# Patient Record
Sex: Female | Born: 1950 | Race: White | Hispanic: No | Marital: Married | Smoking: Former smoker
Health system: Southern US, Community
[De-identification: ages and names within clinical notes are randomized; demographics above are authoritative.]

## PROBLEM LIST (undated history)

## (undated) DIAGNOSIS — Z8489 Family history of other specified conditions: Secondary | ICD-10-CM

## (undated) DIAGNOSIS — R51 Headache: Secondary | ICD-10-CM

## (undated) DIAGNOSIS — F329 Major depressive disorder, single episode, unspecified: Secondary | ICD-10-CM

## (undated) DIAGNOSIS — Z87442 Personal history of urinary calculi: Secondary | ICD-10-CM

## (undated) DIAGNOSIS — Z9889 Other specified postprocedural states: Secondary | ICD-10-CM

## (undated) DIAGNOSIS — R002 Palpitations: Secondary | ICD-10-CM

## (undated) DIAGNOSIS — F32A Depression, unspecified: Secondary | ICD-10-CM

## (undated) DIAGNOSIS — G43909 Migraine, unspecified, not intractable, without status migrainosus: Secondary | ICD-10-CM

## (undated) DIAGNOSIS — K219 Gastro-esophageal reflux disease without esophagitis: Secondary | ICD-10-CM

## (undated) DIAGNOSIS — R112 Nausea with vomiting, unspecified: Secondary | ICD-10-CM

## (undated) DIAGNOSIS — I10 Essential (primary) hypertension: Secondary | ICD-10-CM

## (undated) DIAGNOSIS — J449 Chronic obstructive pulmonary disease, unspecified: Secondary | ICD-10-CM

## (undated) DIAGNOSIS — F419 Anxiety disorder, unspecified: Secondary | ICD-10-CM

## (undated) HISTORY — PX: APPENDECTOMY: SHX54

## (undated) HISTORY — DX: Migraine, unspecified, not intractable, without status migrainosus: G43.909

## (undated) HISTORY — DX: Palpitations: R00.2

---

## 1974-01-04 HISTORY — PX: CHOLECYSTECTOMY: SHX55

## 1993-01-04 HISTORY — PX: ABDOMINAL HYSTERECTOMY: SHX81

## 2003-01-16 ENCOUNTER — Ambulatory Visit (HOSPITAL_COMMUNITY): Admission: RE | Admit: 2003-01-16 | Discharge: 2003-01-16 | Payer: Self-pay | Admitting: Cardiology

## 2004-12-05 ENCOUNTER — Emergency Department: Payer: Self-pay | Admitting: Emergency Medicine

## 2009-01-04 HISTORY — PX: OTHER SURGICAL HISTORY: SHX169

## 2009-03-17 ENCOUNTER — Inpatient Hospital Stay (HOSPITAL_COMMUNITY): Admission: AD | Admit: 2009-03-17 | Discharge: 2009-03-22 | Payer: Self-pay | Admitting: General Surgery

## 2009-03-19 ENCOUNTER — Encounter (INDEPENDENT_AMBULATORY_CARE_PROVIDER_SITE_OTHER): Payer: Self-pay | Admitting: General Surgery

## 2010-01-04 HISTORY — PX: EYE SURGERY: SHX253

## 2010-01-25 ENCOUNTER — Encounter: Payer: Self-pay | Admitting: Family Medicine

## 2010-03-27 ENCOUNTER — Ambulatory Visit (HOSPITAL_COMMUNITY): Payer: Self-pay

## 2010-03-27 ENCOUNTER — Ambulatory Visit (HOSPITAL_COMMUNITY)
Admission: AD | Admit: 2010-03-27 | Discharge: 2010-03-27 | Disposition: A | Discharge status: Home / Self Care | Payer: Self-pay | Source: Ambulatory Visit | Attending: Ophthalmology | Admitting: Ophthalmology

## 2010-03-27 DIAGNOSIS — Z01818 Encounter for other preprocedural examination: Secondary | ICD-10-CM | POA: Insufficient documentation

## 2010-03-27 DIAGNOSIS — Z01810 Encounter for preprocedural cardiovascular examination: Secondary | ICD-10-CM | POA: Insufficient documentation

## 2010-03-27 DIAGNOSIS — H44009 Unspecified purulent endophthalmitis, unspecified eye: Secondary | ICD-10-CM | POA: Insufficient documentation

## 2010-03-27 DIAGNOSIS — Z01812 Encounter for preprocedural laboratory examination: Secondary | ICD-10-CM | POA: Insufficient documentation

## 2010-03-27 DIAGNOSIS — H11419 Vascular abnormalities of conjunctiva, unspecified eye: Secondary | ICD-10-CM | POA: Insufficient documentation

## 2010-03-27 LAB — GRAM STAIN: Special Requests: 2

## 2010-03-27 LAB — BASIC METABOLIC PANEL
BUN: 14 mg/dL (ref 6–23)
CO2: 28 mEq/L (ref 19–32)
Chloride: 102 mEq/L (ref 96–112)
Creatinine, Ser: 0.59 mg/dL (ref 0.4–1.2)
GFR calc Af Amer: >60 mL/min (ref >60)
Potassium: 4 mEq/L (ref 3.5–5.1)

## 2010-03-27 LAB — CBC
Hemoglobin: 12.5 g/dL (ref 12.0–15.0)
MCH: 33.2 pg (ref 26.0–34.0)
MCV: 98.7 fL (ref 78.0–100.0)
RBC: 3.77 MIL/uL — ABNORMAL LOW (ref 3.87–5.11)
WBC: 8.2 10*3/uL (ref 4.0–10.5)

## 2010-03-27 LAB — SURGICAL PCR SCREEN: MRSA, PCR: NEGATIVE

## 2010-03-29 LAB — EYE CULTURE

## 2010-03-30 LAB — BASIC METABOLIC PANEL
BUN: 17 mg/dL (ref 6–23)
CO2: 25 mEq/L (ref 19–32)
CO2: 26 mEq/L (ref 19–32)
CO2: 31 mEq/L (ref 19–32)
Calcium: 8.3 mg/dL — ABNORMAL LOW (ref 8.4–10.5)
Calcium: 8.6 mg/dL (ref 8.4–10.5)
Chloride: 104 mEq/L (ref 96–112)
Creatinine, Ser: 0.5 mg/dL (ref 0.4–1.2)
Creatinine, Ser: 0.66 mg/dL (ref 0.4–1.2)
Creatinine, Ser: 0.72 mg/dL (ref 0.4–1.2)
GFR calc Af Amer: >60 mL/min (ref >60)
GFR calc Af Amer: >60 mL/min (ref >60)
GFR calc non Af Amer: >60 mL/min (ref >60)
GFR calc non Af Amer: >60 mL/min (ref >60)
Glucose, Bld: 95 mg/dL (ref 70–99)
Potassium: 3.4 mEq/L — ABNORMAL LOW (ref 3.5–5.1)
Sodium: 139 mEq/L (ref 135–145)
Sodium: 141 mEq/L (ref 135–145)

## 2010-03-30 LAB — CBC
HCT: 33.7 % — ABNORMAL LOW (ref 36.0–46.0)
Hemoglobin: 9.5 g/dL — ABNORMAL LOW (ref 12.0–15.0)
MCHC: 34.3 g/dL (ref 30.0–36.0)
MCHC: 34.4 g/dL (ref 30.0–36.0)
MCHC: 34.7 g/dL (ref 30.0–36.0)
MCV: 100.5 fL — ABNORMAL HIGH (ref 78.0–100.0)
Platelets: 254 10*3/uL (ref 150–400)
RBC: 2.74 MIL/uL — ABNORMAL LOW (ref 3.87–5.11)
RDW: 13.8 % (ref 11.5–15.5)
WBC: 10.2 10*3/uL (ref 4.0–10.5)
WBC: 6.9 10*3/uL (ref 4.0–10.5)

## 2010-03-30 LAB — DIFFERENTIAL
Basophils Absolute: 0 10*3/uL (ref 0.0–0.1)
Basophils Relative: 0 % (ref 0–1)
Basophils Relative: 0 % (ref 0–1)
Basophils Relative: 1 % (ref 0–1)
Eosinophils Absolute: 0.1 10*3/uL (ref 0.0–0.7)
Eosinophils Relative: 1 % (ref 0–5)
Lymphocytes Relative: 14 % (ref 12–46)
Lymphocytes Relative: 16 % (ref 12–46)
Lymphs Abs: 1.3 10*3/uL (ref 0.7–4.0)
Monocytes Relative: 7 % (ref 3–12)
Monocytes Relative: 8 % (ref 3–12)
Neutro Abs: 5.1 10*3/uL (ref 1.7–7.7)
Neutro Abs: 6.3 10*3/uL (ref 1.7–7.7)
Neutrophils Relative %: 74 % (ref 43–77)
Neutrophils Relative %: 76 % (ref 43–77)
Neutrophils Relative %: 79 % — ABNORMAL HIGH (ref 43–77)

## 2010-03-30 LAB — VANCOMYCIN, TROUGH: Vancomycin Tr: 11.5 ug/mL (ref 10.0–20.0)

## 2010-04-03 LAB — EYE CULTURE: Culture: NO GROWTH

## 2010-04-03 NOTE — Op Note (Signed)
  NAMESATYA, Alisha Shaw                  ACCOUNT NO.:  [id number]  MEDICAL RECORD NO.:  [id number]           PATIENT TYPE:  O  LOCATION:  SDSC                         FACILITY:  [facility]  PHYSICIAN:  John D. Ashley Royalty, M.D. 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  PROCEDURES:  Cultures of eyelids, aqueous and vitreous, pars plana vitrectomy, Endo cautery, vitreous injection of antibiotics and anti- inflammatory medications, subconjunctival injections of antibiotics and anti-inflammatory medications in the left eye.  SURGEON:  Beulah Gandy. Ashley Royalty, MD  ASSISTANT:  Rosalie Doctor, MA  ANESTHESIA:  General.  DETAILS OF PROCEDURE:  The eyelids and lashes were cultured eye with culture plates on the table.  The vitreous was cultured with 27-gauge needle biopsy.  The vitreous material was placed on culture plates and culture media.  The aqueous was cultured and placed on culture media.  A 25-gauge trocars placed at 10, 2, and 4 o'clock, infusion at 4 o'clock. The lighted pick and the cutter placed at 10 and 2 o'clock respectively. The pars plana vitrectomy was begun just behind the cataractous lens. Dense white material was encountered immediately and this was removed centrally in a core fashion.  The vitrectomy was widened and it was apparent that the entire retina was white.  The vitrectomy was widened into the mid peripheral region where a band of bleeding retina was seen temporally.  These areas were cauterized with the vitreous cutter with the Endo cautery.  Vitrectomy was carried out into the far periphery where additional infected vitreous material was removed.  The vitreous cavity was rinsed and rinsed so that most white cells were removed.  The instruments removed from the eye.  Vancomycin 1 mg and 110  mL was injected into the vitreous cavity, ceftazidime 2.25 mg in 0.1 mL was injected in the vitreous cavity, Decadron 400 mcg in 110 mL was injected into the vitreous cavity.  The pressure was equalized and 25-gauge trocars were removed.  Subconjunctival injections of vancomycin 25 mg, ceftazidime 100 mg, Decadron 10 mg were injected around the globe for, intravenous vancomycin 1 gram, and Kefzol 1 gram were given after cultures were taken.  Polysporin ophthalmic ointment, patch and shield were placed.  Closing pressure was 10 with a Risk manager.  The patient was awakened and taken to recovery in satisfactory condition.  COMPLICATIONS:  None.  DURATION:  One hour.     Beulah Gandy. Ashley Royalty, M.D.     JDM/MEDQ  D:  03/27/2010  T:  03/28/2010  Job:  [id number]  Electronically Signed by Alan Mulder M.D. on 04/03/2010 06:14:09 AM

## 2010-04-27 LAB — FUNGUS CULTURE W SMEAR: Fungal Smear: NONE SEEN

## 2010-08-24 ENCOUNTER — Encounter (INDEPENDENT_AMBULATORY_CARE_PROVIDER_SITE_OTHER): Payer: Self-pay | Admitting: Ophthalmology

## 2010-08-24 DIAGNOSIS — H43819 Vitreous degeneration, unspecified eye: Secondary | ICD-10-CM

## 2010-08-24 DIAGNOSIS — H33009 Unspecified retinal detachment with retinal break, unspecified eye: Secondary | ICD-10-CM

## 2010-08-24 DIAGNOSIS — H353290 Exudative age-related macular degeneration, unspecified eye, stage unspecified: Secondary | ICD-10-CM

## 2010-08-24 DIAGNOSIS — H3530 Unspecified macular degeneration: Secondary | ICD-10-CM

## 2010-10-26 ENCOUNTER — Encounter (INDEPENDENT_AMBULATORY_CARE_PROVIDER_SITE_OTHER): Payer: Self-pay | Admitting: Ophthalmology

## 2010-10-26 DIAGNOSIS — H43819 Vitreous degeneration, unspecified eye: Secondary | ICD-10-CM

## 2010-10-26 DIAGNOSIS — H33009 Unspecified retinal detachment with retinal break, unspecified eye: Secondary | ICD-10-CM

## 2010-10-26 DIAGNOSIS — H2510 Age-related nuclear cataract, unspecified eye: Secondary | ICD-10-CM

## 2010-10-26 DIAGNOSIS — H3530 Unspecified macular degeneration: Secondary | ICD-10-CM

## 2011-01-11 ENCOUNTER — Encounter (INDEPENDENT_AMBULATORY_CARE_PROVIDER_SITE_OTHER): Payer: Self-pay | Admitting: Ophthalmology

## 2011-01-11 DIAGNOSIS — H43819 Vitreous degeneration, unspecified eye: Secondary | ICD-10-CM

## 2011-01-11 DIAGNOSIS — H33009 Unspecified retinal detachment with retinal break, unspecified eye: Secondary | ICD-10-CM

## 2011-01-11 DIAGNOSIS — H3530 Unspecified macular degeneration: Secondary | ICD-10-CM

## 2011-01-11 DIAGNOSIS — H2510 Age-related nuclear cataract, unspecified eye: Secondary | ICD-10-CM

## 2011-04-12 ENCOUNTER — Ambulatory Visit (INDEPENDENT_AMBULATORY_CARE_PROVIDER_SITE_OTHER): Payer: Self-pay | Admitting: Ophthalmology

## 2011-04-12 DIAGNOSIS — H33009 Unspecified retinal detachment with retinal break, unspecified eye: Secondary | ICD-10-CM

## 2011-04-12 DIAGNOSIS — H3530 Unspecified macular degeneration: Secondary | ICD-10-CM

## 2011-04-12 DIAGNOSIS — H2510 Age-related nuclear cataract, unspecified eye: Secondary | ICD-10-CM

## 2011-04-12 DIAGNOSIS — H43819 Vitreous degeneration, unspecified eye: Secondary | ICD-10-CM

## 2011-07-12 ENCOUNTER — Encounter (INDEPENDENT_AMBULATORY_CARE_PROVIDER_SITE_OTHER): Payer: Self-pay | Admitting: Ophthalmology

## 2011-07-12 DIAGNOSIS — H3530 Unspecified macular degeneration: Secondary | ICD-10-CM

## 2011-07-12 DIAGNOSIS — H43819 Vitreous degeneration, unspecified eye: Secondary | ICD-10-CM

## 2011-07-12 DIAGNOSIS — H2510 Age-related nuclear cataract, unspecified eye: Secondary | ICD-10-CM

## 2011-07-12 DIAGNOSIS — I10 Essential (primary) hypertension: Secondary | ICD-10-CM

## 2011-07-12 DIAGNOSIS — H35039 Hypertensive retinopathy, unspecified eye: Secondary | ICD-10-CM

## 2011-07-12 DIAGNOSIS — H33009 Unspecified retinal detachment with retinal break, unspecified eye: Secondary | ICD-10-CM

## 2011-07-13 ENCOUNTER — Encounter (INDEPENDENT_AMBULATORY_CARE_PROVIDER_SITE_OTHER): Payer: Self-pay | Admitting: Ophthalmology

## 2011-11-15 ENCOUNTER — Ambulatory Visit (INDEPENDENT_AMBULATORY_CARE_PROVIDER_SITE_OTHER): Payer: Self-pay | Admitting: Ophthalmology

## 2011-11-15 DIAGNOSIS — H3530 Unspecified macular degeneration: Secondary | ICD-10-CM

## 2011-11-15 DIAGNOSIS — H33009 Unspecified retinal detachment with retinal break, unspecified eye: Secondary | ICD-10-CM

## 2011-11-15 DIAGNOSIS — H35039 Hypertensive retinopathy, unspecified eye: Secondary | ICD-10-CM

## 2011-11-15 DIAGNOSIS — H2510 Age-related nuclear cataract, unspecified eye: Secondary | ICD-10-CM

## 2011-11-15 DIAGNOSIS — H43819 Vitreous degeneration, unspecified eye: Secondary | ICD-10-CM

## 2011-11-15 DIAGNOSIS — I10 Essential (primary) hypertension: Secondary | ICD-10-CM

## 2012-01-08 IMAGING — CR DG CHEST 2V
2 series · 2 of 2 positions shown · non-contrast
Comparison: 03/20/2009

CLINICAL DATA: Preop, hypertension

CHEST - 2 VIEW

[view not recorded (1 of 2)]
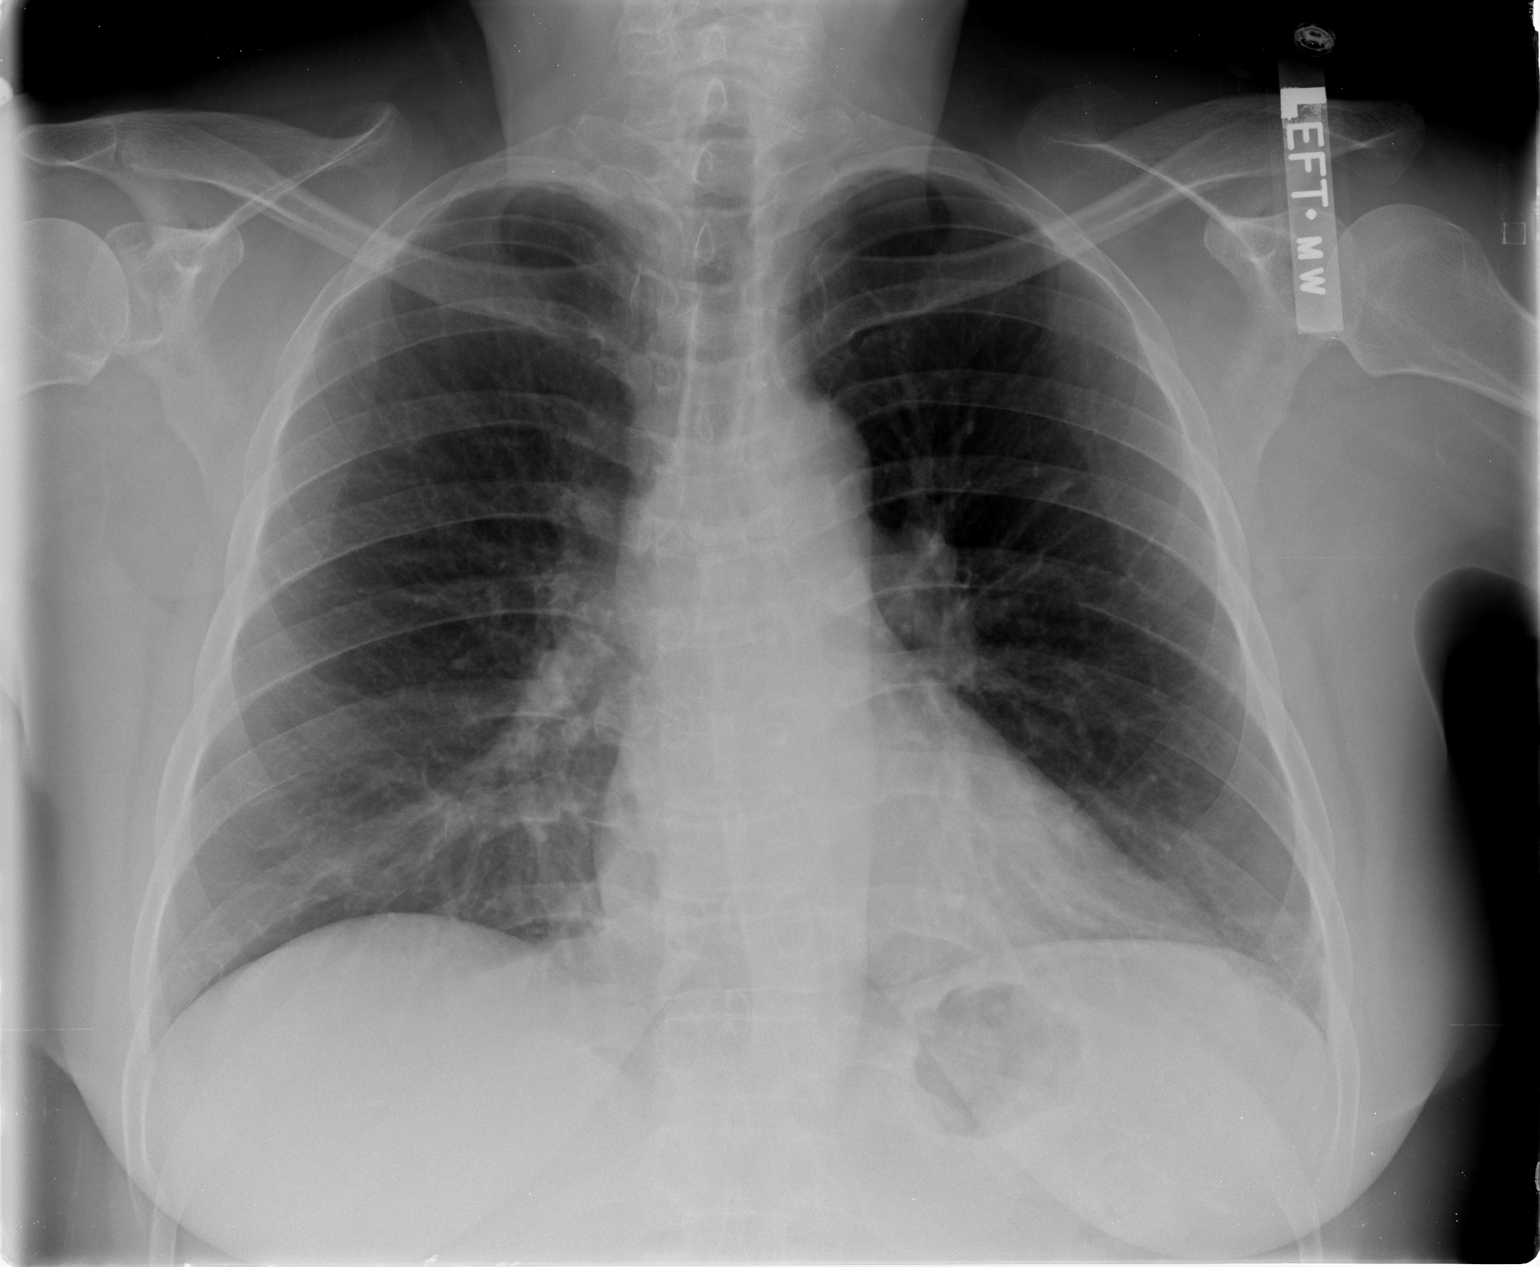

[view not recorded (2 of 2)]
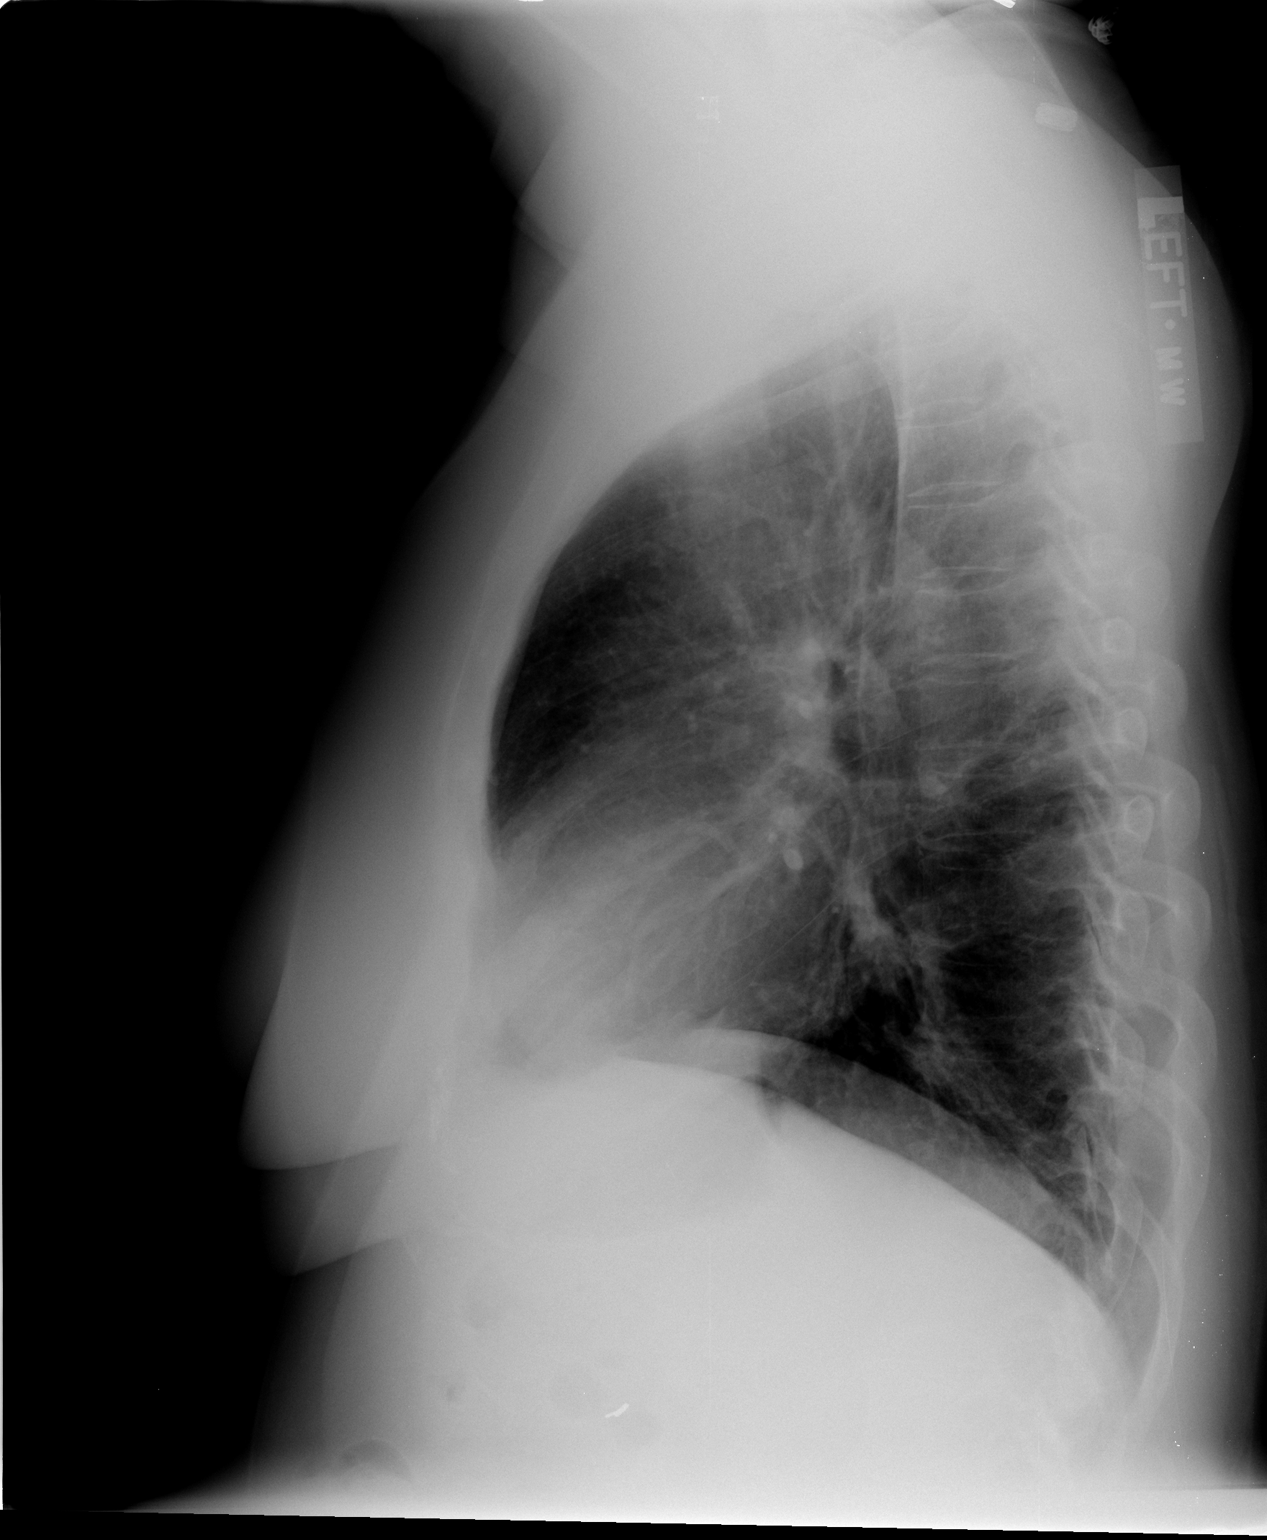

[2 of 2 positions shown; findings below may reference images not displayed]

FINDINGS: Cardiomediastinal silhouette is stable.  No acute
infiltrate or pleural effusion.  No pulmonary edema.  Bony thorax
is unremarkable.
IMPRESSION: .  No active disease.

## 2012-05-15 ENCOUNTER — Ambulatory Visit (INDEPENDENT_AMBULATORY_CARE_PROVIDER_SITE_OTHER): Payer: BC Managed Care – PPO | Admitting: Ophthalmology

## 2012-05-15 DIAGNOSIS — I10 Essential (primary) hypertension: Secondary | ICD-10-CM

## 2012-05-15 DIAGNOSIS — H2510 Age-related nuclear cataract, unspecified eye: Secondary | ICD-10-CM

## 2012-05-15 DIAGNOSIS — H43819 Vitreous degeneration, unspecified eye: Secondary | ICD-10-CM

## 2012-05-15 DIAGNOSIS — H35039 Hypertensive retinopathy, unspecified eye: Secondary | ICD-10-CM

## 2012-05-15 DIAGNOSIS — H3530 Unspecified macular degeneration: Secondary | ICD-10-CM

## 2012-07-13 ENCOUNTER — Encounter (HOSPITAL_COMMUNITY): Payer: Self-pay

## 2012-07-13 ENCOUNTER — Encounter (HOSPITAL_COMMUNITY)
Admission: RE | Admit: 2012-07-13 | Discharge: 2012-07-13 | Disposition: A | Discharge status: Home / Self Care | Payer: BC Managed Care – PPO | Source: Ambulatory Visit | Attending: General Surgery | Admitting: General Surgery

## 2012-07-13 HISTORY — DX: Essential (primary) hypertension: I10

## 2012-07-13 HISTORY — DX: Gastro-esophageal reflux disease without esophagitis: K21.9

## 2012-07-13 HISTORY — DX: Headache: R51

## 2012-07-13 HISTORY — DX: Anxiety disorder, unspecified: F41.9

## 2012-07-13 HISTORY — DX: Depression, unspecified: F32.A

## 2012-07-13 HISTORY — DX: Major depressive disorder, single episode, unspecified: F32.9

## 2012-07-13 LAB — CBC WITH DIFFERENTIAL/PLATELET
HCT: 36.4 % (ref 36.0–46.0)
Hemoglobin: 12.6 g/dL (ref 12.0–15.0)
Lymphocytes Relative: 19 % (ref 12–46)
MCHC: 34.6 g/dL (ref 30.0–36.0)
MCV: 99.2 fL (ref 78.0–100.0)
Monocytes Absolute: 0.7 10*3/uL (ref 0.1–1.0)
Monocytes Relative: 8 % (ref 3–12)
Neutro Abs: 6.1 10*3/uL (ref 1.7–7.7)
WBC: 8.7 10*3/uL (ref 4.0–10.5)

## 2012-07-13 LAB — BASIC METABOLIC PANEL
BUN: 19 mg/dL (ref 6–23)
CO2: 30 mEq/L (ref 19–32)
Chloride: 96 mEq/L (ref 96–112)
GFR calc Af Amer: >90 mL/min (ref >90)
Potassium: 3.1 mEq/L — ABNORMAL LOW (ref 3.5–5.1)

## 2012-07-13 MED ORDER — CHLORHEXIDINE GLUCONATE 4 % EX LIQD: 1.0000 "application " | Freq: Once | CUTANEOUS | Status: DC

## 2012-07-13 NOTE — Patient Instructions (Addendum)
Alisha Shaw  07/13/2012   Your procedure is scheduled on:  07/17/12  Report to [facility] at 07:20 AM.  Call this number if you have problems the morning of surgery: [phone]   Remember:   Do not eat food or drink liquids after midnight.   Take these medicines the morning of surgery with A SIP OF WATER: Fluoxetine and Hydrochlorothiazide.   Do not wear jewelry, make-up or nail polish.  Do not wear lotions, powders, or perfumes.   Do not shave 48 hours prior to surgery. Men may shave face and neck.  Do not bring valuables to the hospital.  [facility] is not responsible for any belongings or valuables.  Contacts, dentures or bridgework may not be worn into surgery.  Leave suitcase in the car. After surgery it may be brought to your room.  For patients admitted to the hospital, checkout time is 11:00 AM the [place] of discharge.   Patients discharged the [place] of surgery will not be allowed to drive home.    Special Instructions: Shower using CHG 2 nights before surgery and the night before surgery.  If you shower the [place] of surgery use CHG.  Use special wash - you have one bottle of CHG for all showers.  You should use approximately 1/3 of the bottle for each shower.   Please read over the following fact sheets that you were given: Pain Booklet, Surgical Site Infection Prevention, Anesthesia Post-op Instructions and Care and Recovery After Surgery    Hemorrhoidectomy Hemorrhoidectomy is surgery to remove hemorrhoids. Hemorrhoids are veins that have become swollen in the rectum. The rectum is the area from the bottom end of the intestines to the opening where bowel movements leave the body. Hemorrhoids can be uncomfortable. They can cause itching, bleeding and pain if a blood clot forms in them (thrombose). If hemorrhoids are small, surgery may not be needed. But if they cover a larger area, surgery is usually suggested.  LET YOUR CAREGIVER KNOW ABOUT:   Any allergies.  All medications you  are taking, including:  Herbs, eyedrops, over-the-counter medications and creams.  Blood thinners (anticoagulants), aspirin or other drugs that could affect blood clotting.  Use of steroids (by mouth or as creams).  Previous problems with anesthetics, including local anesthetics.  Possibility of pregnancy, if this applies.  Any history of blood clots.  Any history of bleeding or other blood problems.  Previous surgery.  Smoking history.  Other health problems. RISKS AND COMPLICATIONS All surgery carries some risk. However, hemorrhoid surgery usually goes smoothly. Possible complications could include:  Urinary retention.  Bleeding.  Infection.  A painful incision.  A reaction to the anesthesia (this is not common). BEFORE THE PROCEDURE   Stop using aspirin and non-steroidal anti-inflammatory drugs (NSAIDs) for pain relief. This includes prescription drugs and over-the-counter drugs such as ibuprofen and naproxen. Also stop taking vitamin E. If possible, do this two weeks before your surgery.  If you take blood-thinners, ask your healthcare provider when you should stop taking them.  You will probably have blood and urine tests done several days before your surgery.  Do not eat or drink for about 8 hours before the surgery.  Arrive at least an hour before the surgery, or whenever your surgeon recommends. This will give you time to check in and fill out any needed paperwork.  Hemorrhoidectomy is often an outpatient procedure. This means you will be able to go home the same [place]. Sometimes, though, people stay overnight  in the hospital after the procedure. Ask your surgeon what to expect. Either way, make arrangements in advance for someone to drive you home. PROCEDURE   The preparation:  You will change into a hospital gown.  You will be given an IV. A needle will be inserted in your arm. Medication can flow directly into your body through this needle.  You might be  given an enema to clear your rectum.  Once in the operating room, you will probably lie on your side or be repositioned later to lying on your stomach.  You will be given anesthesia (medication) so you will not feel anything during the surgery. The surgery often is done with local anesthesia (the area near the hemorrhoids will be numb and you will be drowsy but awake). Sometimes, general anesthesia is used (you will be asleep during the procedure).  The procedure:  There are a few different procedures for hemorrhoids. Be sure to ask you surgeon about the procedure, the risks and benefits.  Be sure to ask about what you need to do to take care of the wound, if there is one. AFTER THE PROCEDURE  You will stay in a recovery area until the anesthesia has worn off. Your blood pressure and pulse will be checked every so often.  You may feel a lot of pain in the area of the rectum.  Take all pain medication prescribed by your surgeon. Ask before taking any over-the-counter pain medicines.  Sometimes sitting in a warm bath can help relieve your pain.  To make sure you have bowel movements without straining:  You will probably need to take stool softeners (usually a pill) for a few days.  You should drink 8 to 10 glasses of water each Study.  Your activity will be restricted for awhile. Ask your caregiver for a list of what you should and should not do while you recover. Document Released: 10/18/2008 Document Revised: 03/15/2011 Document Reviewed: 10/18/2008 [facility] Patient Information 2014 [place], [place].    PATIENT INSTRUCTIONS POST-ANESTHESIA  IMMEDIATELY FOLLOWING SURGERY:  Do not drive or operate machinery for the first twenty four hours after surgery.  Do not make any important decisions for twenty four hours after surgery or while taking narcotic pain medications or sedatives.  If you develop intractable nausea and vomiting or a severe headache please notify your doctor  immediately.  FOLLOW-UP:  Please make an appointment with your surgeon as instructed. You do not need to follow up with anesthesia unless specifically instructed to do so.  WOUND CARE INSTRUCTIONS (if applicable):  Keep a dry clean dressing on the anesthesia/puncture wound site if there is drainage.  Once the wound has quit draining you may leave it open to air.  Generally you should leave the bandage intact for twenty four hours unless there is drainage.  If the epidural site drains for more than 36-48 hours please call the anesthesia department.  QUESTIONS?:  Please feel free to call your physician or the hospital operator if you have any questions, and they will be happy to assist you.

## 2012-07-13 NOTE — H&P (Signed)
  NTS SOAP Note  Vital Signs:  Vitals as of: 07/13/2012: Systolic 154: Diastolic 73: Heart Rate 88: Temp 97.82F: Height 58ft 4in: Weight 181Lbs 0 Ounces: Pain Level 5: BMI 31.07  BMI : 31.07 kg/m2  Subjective: This 62 Years 24 Months old Female presents for of blood per rectum.  Has had hemorrhoidal problems for years.  Denies constipation.  Never has had a colonoscopy.  Feels pressure sensation in the rectum.  Review of Symptoms:  Constitutional:unremarkable   Head:unremarkable    Eyes:  blurred vision bilateral sinus problems Respiratory:  wheezing Gastrointestin    nausea,heartburn Genitourinary:unremarkable       neck and back pain Skin:unremarkable Hematolgic/Lymphatic:unremarkable     Allergic/Immunologic:unremarkable     Past Medical History:    Reviewed   Past Medical History  Surgical History: cholecystectomy, TAH, eye surgery Medical Problems:  High Blood pressure, High cholesterol, macular degeneration Allergies: nkda Medications: HCTZ, losartin, prozac, simvastatin, butabutol   Social History:Reviewed  Social History  Preferred Language: English Race:  White Ethnicity: Not Hispanic / Latino Age: 62 Years 5 Months Marital Status:  M Alcohol: socially Recreational drug(s):  No   Smoking Status: Current every Tays smoker reviewed on 07/13/2012 Started Date: 01/05/1971 Packs per Kloss: 2.00 Functional Status reviewed on mm/dd/yyyy ------------------------------------------------ Bathing: Normal Cooking: Normal Dressing: Normal Driving: Normal Eating: Normal Managing Meds: Normal Oral Care: Normal Shopping: Normal Toileting: Normal Transferring: Normal Walking: Normal Cognitive Status reviewed on mm/dd/yyyy ------------------------------------------------ Attention: Normal Decision Making: Normal Language: Normal Memory: Normal Motor: Normal Perception: Normal Problem Solving: Normal Visual and Spatial:  Normal   Family History:  Reviewed  Family Health History Mother, Deceased; Heart disease;  Father, Deceased; Heart disease;     Objective Information: General:  Well appearing, well nourished in no distress. Heart:  RRR, no murmur Lungs:    CTA bilaterally, no wheezes, rhonchi, rales.  Breathing unlabored.   Protruding hemorrhoid present anteriorly.  No mass noted in rectum.  Normal sphincter tone.  Assessment:Hemorrhoidal bleeding  Diagnosis &amp; Procedure Smart Code   Plan:Scheduled for hemorrhoidectomy on 07/17/12.  Will get TCS later this year.   Patient Education:Alternative treatments to surgery were discussed with patient (and family).  Risks and benefits  of procedure were fully explained to the patient (and family) who gave informed consent. Patient/family questions were addressed.  Follow-up:Pending Surgery

## 2012-07-13 NOTE — Progress Notes (Signed)
Pt's pre-op labwork for surgery on 07/17/12 showed a K+ level of 3.1. Pt is on HCTZ. Dr. Jayme Cloud notified and he notified Dr. York Ram and he is going to order K+ supplementation. Dr. Jayme Cloud is ok with this plan of care.

## 2012-07-17 ENCOUNTER — Encounter (HOSPITAL_COMMUNITY)
Admission: RE | Disposition: A | Discharge status: Home / Self Care | Payer: Self-pay | Source: Ambulatory Visit | Attending: General Surgery

## 2012-07-17 ENCOUNTER — Ambulatory Visit (HOSPITAL_COMMUNITY): Payer: BC Managed Care – PPO | Admitting: Anesthesiology

## 2012-07-17 ENCOUNTER — Encounter (HOSPITAL_COMMUNITY): Payer: Self-pay | Admitting: *Deleted

## 2012-07-17 ENCOUNTER — Encounter (HOSPITAL_COMMUNITY): Payer: Self-pay | Admitting: Anesthesiology

## 2012-07-17 ENCOUNTER — Ambulatory Visit (HOSPITAL_COMMUNITY)
Admission: RE | Admit: 2012-07-17 | Discharge: 2012-07-17 | Disposition: A | Discharge status: Home / Self Care | Payer: BC Managed Care – PPO | Source: Ambulatory Visit | Attending: General Surgery | Admitting: General Surgery

## 2012-07-17 DIAGNOSIS — I10 Essential (primary) hypertension: Secondary | ICD-10-CM | POA: Insufficient documentation

## 2012-07-17 DIAGNOSIS — Z01812 Encounter for preprocedural laboratory examination: Secondary | ICD-10-CM | POA: Insufficient documentation

## 2012-07-17 DIAGNOSIS — Z79899 Other long term (current) drug therapy: Secondary | ICD-10-CM | POA: Insufficient documentation

## 2012-07-17 DIAGNOSIS — K648 Other hemorrhoids: Secondary | ICD-10-CM | POA: Insufficient documentation

## 2012-07-17 DIAGNOSIS — Z01810 Encounter for preprocedural cardiovascular examination: Secondary | ICD-10-CM | POA: Insufficient documentation

## 2012-07-17 DIAGNOSIS — K644 Residual hemorrhoidal skin tags: Secondary | ICD-10-CM | POA: Insufficient documentation

## 2012-07-17 HISTORY — PX: HEMORRHOID SURGERY: SHX153

## 2012-07-17 LAB — POCT I-STAT 4, (NA,K, GLUC, HGB,HCT)
Glucose, Bld: 96 mg/dL (ref 70–99)
HCT: 33 % — ABNORMAL LOW (ref 36.0–46.0)
Hemoglobin: 11.2 g/dL — ABNORMAL LOW (ref 12.0–15.0)
Potassium: 3.9 mEq/L (ref 3.5–5.1)

## 2012-07-17 SURGERY — HEMORRHOIDECTOMY
Anesthesia: General | Site: Rectum | Wound class: Dirty or Infected

## 2012-07-17 MED ORDER — SUCCINYLCHOLINE CHLORIDE 20 MG/ML IJ SOLN
INTRAMUSCULAR | Status: DC | PRN
  Administered 2012-07-17: 120 mg via INTRAVENOUS

## 2012-07-17 MED ORDER — FENTANYL CITRATE 0.05 MG/ML IJ SOLN
INTRAMUSCULAR | Status: AC
  Filled 2012-07-17: qty 2

## 2012-07-17 MED ORDER — GLYCOPYRROLATE 0.2 MG/ML IJ SOLN
0.2000 mg | Freq: Once | INTRAMUSCULAR | Status: AC
  Administered 2012-07-17: 0.2 mg via INTRAVENOUS

## 2012-07-17 MED ORDER — LIDOCAINE VISCOUS 2 % MT SOLN
OROMUCOSAL | Status: AC
  Filled 2012-07-17: qty 15

## 2012-07-17 MED ORDER — HYDROCODONE-ACETAMINOPHEN 5-325 MG PO TABS: 1.0000 | ORAL_TABLET | ORAL | Status: DC | PRN

## 2012-07-17 MED ORDER — SUCCINYLCHOLINE CHLORIDE 20 MG/ML IJ SOLN
INTRAMUSCULAR | Status: AC
  Filled 2012-07-17: qty 1

## 2012-07-17 MED ORDER — ROCURONIUM BROMIDE 100 MG/10ML IV SOLN
INTRAVENOUS | Status: DC | PRN
  Administered 2012-07-17: 5 mg via INTRAVENOUS

## 2012-07-17 MED ORDER — OXYCODONE-ACETAMINOPHEN 7.5-325 MG PO TABS: 1.0000 | ORAL_TABLET | Freq: Four times a day (QID) | ORAL | Status: AC | PRN

## 2012-07-17 MED ORDER — PROPOFOL 10 MG/ML IV EMUL
INTRAVENOUS | Status: AC
  Filled 2012-07-17: qty 20

## 2012-07-17 MED ORDER — KETOROLAC TROMETHAMINE 30 MG/ML IJ SOLN
30.0000 mg | Freq: Once | INTRAMUSCULAR | Status: AC
  Administered 2012-07-17: 30 mg via INTRAVENOUS

## 2012-07-17 MED ORDER — MIDAZOLAM HCL 2 MG/2ML IJ SOLN
1.0000 mg | INTRAMUSCULAR | Status: DC | PRN
  Administered 2012-07-17 (×2): 2 mg via INTRAVENOUS

## 2012-07-17 MED ORDER — BUPIVACAINE HCL (PF) 0.5 % IJ SOLN
INTRAMUSCULAR | Status: DC | PRN
  Administered 2012-07-17: 10 mL

## 2012-07-17 MED ORDER — FENTANYL CITRATE 0.05 MG/ML IJ SOLN
INTRAMUSCULAR | Status: DC | PRN
  Administered 2012-07-17 (×2): 50 ug via INTRAVENOUS

## 2012-07-17 MED ORDER — BUPIVACAINE HCL (PF) 0.5 % IJ SOLN
INTRAMUSCULAR | Status: AC
  Filled 2012-07-17: qty 30

## 2012-07-17 MED ORDER — LIDOCAINE VISCOUS 2 % MT SOLN
OROMUCOSAL | Status: DC | PRN
  Administered 2012-07-17: 20 mL

## 2012-07-17 MED ORDER — SODIUM CHLORIDE 0.9 % IR SOLN
Status: DC | PRN
  Administered 2012-07-17: 1000 mL

## 2012-07-17 MED ORDER — FENTANYL CITRATE 0.05 MG/ML IJ SOLN
25.0000 ug | INTRAMUSCULAR | Status: DC | PRN
  Administered 2012-07-17: 25 ug via INTRAVENOUS
  Administered 2012-07-17 (×4): 50 ug via INTRAVENOUS

## 2012-07-17 MED ORDER — METRONIDAZOLE IN NACL 5-0.79 MG/ML-% IV SOLN
500.0000 mg | INTRAVENOUS | Status: AC
  Administered 2012-07-17: 500 mg via INTRAVENOUS

## 2012-07-17 MED ORDER — LACTATED RINGERS IV SOLN
INTRAVENOUS | Status: DC
  Administered 2012-07-17: 1000 mL via INTRAVENOUS

## 2012-07-17 MED ORDER — LIDOCAINE HCL (CARDIAC) 10 MG/ML IV SOLN
INTRAVENOUS | Status: DC | PRN
  Administered 2012-07-17: 20 mg via INTRAVENOUS

## 2012-07-17 MED ORDER — MIDAZOLAM HCL 2 MG/2ML IJ SOLN
INTRAMUSCULAR | Status: AC
  Filled 2012-07-17: qty 2

## 2012-07-17 MED ORDER — ONDANSETRON HCL 4 MG/2ML IJ SOLN: 4.0000 mg | Freq: Once | INTRAMUSCULAR | Status: DC | PRN

## 2012-07-17 MED ORDER — ONDANSETRON HCL 4 MG/2ML IJ SOLN
INTRAMUSCULAR | Status: AC
  Filled 2012-07-17: qty 2

## 2012-07-17 MED ORDER — DEXAMETHASONE SODIUM PHOSPHATE 4 MG/ML IJ SOLN
4.0000 mg | Freq: Once | INTRAMUSCULAR | Status: AC
  Administered 2012-07-17: 4 mg via INTRAVENOUS

## 2012-07-17 MED ORDER — ONDANSETRON HCL 4 MG/2ML IJ SOLN
4.0000 mg | Freq: Once | INTRAMUSCULAR | Status: AC
  Administered 2012-07-17: 4 mg via INTRAVENOUS

## 2012-07-17 MED ORDER — FENTANYL CITRATE 0.05 MG/ML IJ SOLN: 25.0000 ug | Freq: Once | INTRAMUSCULAR | Status: DC

## 2012-07-17 MED ORDER — PROPOFOL 10 MG/ML IV BOLUS
INTRAVENOUS | Status: DC | PRN
  Administered 2012-07-17: 20 mg via INTRAVENOUS
  Administered 2012-07-17 (×2): 30 mg via INTRAVENOUS
  Administered 2012-07-17: 120 mg via INTRAVENOUS

## 2012-07-17 MED ORDER — METRONIDAZOLE IN NACL 5-0.79 MG/ML-% IV SOLN
INTRAVENOUS | Status: AC
  Filled 2012-07-17: qty 100

## 2012-07-17 MED ORDER — GLYCOPYRROLATE 0.2 MG/ML IJ SOLN
INTRAMUSCULAR | Status: AC
  Filled 2012-07-17: qty 1

## 2012-07-17 MED ORDER — HEMOSTATIC AGENTS (NO CHARGE) OPTIME
TOPICAL | Status: DC | PRN
  Administered 2012-07-17: 1 via TOPICAL

## 2012-07-17 MED ORDER — DEXAMETHASONE SODIUM PHOSPHATE 4 MG/ML IJ SOLN
INTRAMUSCULAR | Status: AC
  Filled 2012-07-17: qty 1

## 2012-07-17 SURGICAL SUPPLY — 29 items
BAG HAMPER (MISCELLANEOUS) ×2 IMPLANT
CLOTH BEACON ORANGE TIMEOUT ST (SAFETY) ×2 IMPLANT
COVER LIGHT HANDLE STERIS (MISCELLANEOUS) ×4 IMPLANT
DECANTER SPIKE VIAL GLASS SM (MISCELLANEOUS) ×2 IMPLANT
DRAPE PROXIMA HALF (DRAPES) ×2 IMPLANT
ELECT REM PT RETURN 9FT ADLT (ELECTROSURGICAL) ×2
ELECTRODE REM PT RTRN 9FT ADLT (ELECTROSURGICAL) ×1 IMPLANT
FORMALIN 10 PREFIL 120ML (MISCELLANEOUS) ×2 IMPLANT
GLOVE BIO SURGEON STRL SZ7.5 (GLOVE) ×2 IMPLANT
GLOVE BIOGEL PI IND STRL 7.5 (GLOVE) ×2 IMPLANT
GLOVE BIOGEL PI IND STRL 8.5 (GLOVE) ×1 IMPLANT
GLOVE BIOGEL PI INDICATOR 7.5 (GLOVE) ×2
GLOVE BIOGEL PI INDICATOR 8.5 (GLOVE) ×1
GLOVE ECLIPSE 7.0 STRL STRAW (GLOVE) ×2 IMPLANT
GOWN STRL REIN XL XLG (GOWN DISPOSABLE) ×6 IMPLANT
HEMOSTAT SURGICEL 4X8 (HEMOSTASIS) ×2 IMPLANT
KIT ROOM TURNOVER AP CYSTO (KITS) ×2 IMPLANT
LIGASURE IMPACT 36 18CM CVD LR (INSTRUMENTS) IMPLANT
MANIFOLD NEPTUNE II (INSTRUMENTS) ×2 IMPLANT
NEEDLE HYPO 25X1 1.5 SAFETY (NEEDLE) ×2 IMPLANT
NS IRRIG 1000ML POUR BTL (IV SOLUTION) ×2 IMPLANT
PACK PERI GYN (CUSTOM PROCEDURE TRAY) ×2 IMPLANT
PAD ARMBOARD 7.5X6 YLW CONV (MISCELLANEOUS) ×2 IMPLANT
SET BASIN LINEN APH (SET/KITS/TRAYS/PACK) ×2 IMPLANT
SHEARS HARMONIC 9CM CVD (BLADE) IMPLANT
SPONGE GAUZE 4X4 12PLY (GAUZE/BANDAGES/DRESSINGS) ×2 IMPLANT
SUT SILK 0 FSL (SUTURE) ×2 IMPLANT
SUT VIC AB 2-0 CT2 27 (SUTURE) ×2 IMPLANT
SYR CONTROL 10ML LL (SYRINGE) ×2 IMPLANT

## 2012-07-17 NOTE — Anesthesia Postprocedure Evaluation (Signed)
  Anesthesia Post-op Note  Patient: Alisha Shaw  Procedure(s) Performed: Procedure(s): HEMORRHOIDECTOMY (N/A)  Patient Location: PACU  Anesthesia Type:General  Level of Consciousness: awake, oriented and patient cooperative  Airway and Oxygen Therapy: Patient Spontanous Breathing and non-rebreather face mask  Post-op Pain: mild  Post-op Assessment: Post-op Vital signs reviewed, Patient's Cardiovascular Status Stable, RESPIRATORY FUNCTION UNSTABLE and No signs of Nausea or vomiting  Post-op Vital Signs: Reviewed and stable  Complications: No apparent anesthesia complications

## 2012-07-17 NOTE — Op Note (Signed)
Patient:  Alisha Shaw  DOB:  March 30, 1950  MRN:  [id number]   Preop Diagnosis:  Bleeding hemorrhoidal disease  Postop Diagnosis:  Same  Procedure:  Extensive hemorrhoidectomy  Surgeon:  Franky Macho, M.D.  Anes:  General  Indications:  Patient is a 62 year old white female presents with bleeding hemorrhoidal disease. On rectal examination, she has significant internal and external hemorrhoidal disease. There is some component of superficial rectal mucosal prolapse. The risks and benefits of the procedure including bleeding and infection were fully explained to the patient, who gave informed consent.  Procedure note:  The patient was placed in the lithotomy position after general anesthesia was administered. The perineum was prepped and draped using usual sterile technique with Betadine. Surgical site confirmation was performed.  On rectal examination, she had multiple internal and external hemorrhoids at the 10:00, 7:00, and 4:00 positions. Sphincter tone was intact. All were removed in continuity using the LigaSure. They were sent to pathology further examination. No abnormal bleeding was noted at the end of the procedure. The external sphincter was noted to be intact. .5% Sensorcaine was instilled the surrounding wound. Surgicel and Viscous Xylocaine rectal packing was then placed.  All tape and needle counts were correct the end of the procedure. Patient was awakened and transferred to PACU in stable condition.  Complications:  None  EBL:  None  Specimen:  Hemorrhoids

## 2012-07-17 NOTE — Interval H&P Note (Signed)
History and Physical Interval Note:  07/17/2012 8:31 AM  Alisha Shaw  has presented today for surgery, with the diagnosis of hemorrhoid  The various methods of treatment have been discussed with the patient and family. After consideration of risks, benefits and other options for treatment, the patient has consented to  Procedure(s): HEMORRHOIDECTOMY (N/A) as a surgical intervention .  The patient's history has been reviewed, patient examined, no change in status, stable for surgery.  I have reviewed the patient's chart and labs.  Questions were answered to the patient's satisfaction.     [name]

## 2012-07-17 NOTE — Transfer of Care (Signed)
Immediate Anesthesia Transfer of Care Note  Patient: Alisha Shaw  Procedure(s) Performed: Procedure(s) (LRB): HEMORRHOIDECTOMY (N/A)  Patient Location: PACU  Anesthesia Type: General  Level of Consciousness: awake  Airway & Oxygen Therapy: Patient Spontanous Breathing and non-rebreather face mask  Post-op Assessment: Report given to PACU RN, Post -op Vital signs reviewed and stable and Patient moving all extremities  Post vital signs: Reviewed and stable  Complications: No apparent anesthesia complications

## 2012-07-17 NOTE — Anesthesia Procedure Notes (Signed)
Procedure Name: Intubation Date/Time: 07/17/2012 10:16 AM Performed by: Franco Nones Pre-anesthesia Checklist: Patient identified, Patient being monitored, Timeout performed, Emergency Drugs available and Suction available Patient Re-evaluated:Patient Re-evaluated prior to inductionOxygen Delivery Method: Circle System Utilized Preoxygenation: Pre-oxygenation with 100% oxygen Intubation Type: IV induction, Cricoid Pressure applied and Rapid sequence Laryngoscope Size: Miller and 2 Grade View: Grade I Tube type: Oral Tube size: 7.0 mm Number of attempts: 2 Airway Equipment and Method: stylet Placement Confirmation: ETT inserted through vocal cords under direct vision,  positive ETCO2 and breath sounds checked- equal and bilateral Secured at: 21 cm Tube secured with: Tape Dental Injury: Teeth and Oropharynx as per pre-operative assessment

## 2012-07-17 NOTE — Anesthesia Preprocedure Evaluation (Addendum)
Anesthesia Evaluation  Patient identified by MRN, date of birth, ID band Patient awake    Reviewed: Allergy & Precautions, H&P , NPO status , Patient's Chart, lab work & pertinent test results  History of Anesthesia Complications Negative for: history of anesthetic complications  Airway Mallampati: II TM Distance: >3 FB     Dental  (+) Teeth Intact   Pulmonary Current Smoker (am cough),  breath sounds clear to auscultation        Cardiovascular hypertension, Pt. on medications Rhythm:Regular Rate:Normal     Neuro/Psych  Headaches, PSYCHIATRIC DISORDERS Anxiety Depression    GI/Hepatic GERD-  Medicated and Controlled,  Endo/Other    Renal/GU      Musculoskeletal   Abdominal   Peds  Hematology   Anesthesia Other Findings   Reproductive/Obstetrics                           Anesthesia Physical Anesthesia Plan  ASA: III  Anesthesia Plan: General   Post-op Pain Management:    Induction: Intravenous, Rapid sequence and Cricoid pressure planned  Airway Management Planned: Oral ETT  Additional Equipment:   Intra-op Plan:   Post-operative Plan: Extubation in OR  Informed Consent: I have reviewed the patients History and Physical, chart, labs and discussed the procedure including the risks, benefits and alternatives for the proposed anesthesia with the patient or authorized representative who has indicated his/her understanding and acceptance.     Plan Discussed with:   Anesthesia Plan Comments:         Anesthesia Quick Evaluation

## 2012-07-19 ENCOUNTER — Encounter (HOSPITAL_COMMUNITY): Payer: Self-pay | Admitting: General Surgery

## 2012-08-29 ENCOUNTER — Other Ambulatory Visit (HOSPITAL_COMMUNITY): Payer: Self-pay | Admitting: Family Medicine

## 2012-08-29 DIAGNOSIS — Z139 Encounter for screening, unspecified: Secondary | ICD-10-CM

## 2012-09-21 ENCOUNTER — Ambulatory Visit (HOSPITAL_COMMUNITY)
Admission: RE | Admit: 2012-09-21 | Discharge: 2012-09-21 | Disposition: A | Discharge status: Home / Self Care | Payer: BC Managed Care – PPO | Source: Ambulatory Visit | Attending: Family Medicine | Admitting: Family Medicine

## 2012-09-21 DIAGNOSIS — Z803 Family history of malignant neoplasm of breast: Secondary | ICD-10-CM | POA: Insufficient documentation

## 2012-09-21 DIAGNOSIS — Z139 Encounter for screening, unspecified: Secondary | ICD-10-CM

## 2012-09-21 DIAGNOSIS — Z1231 Encounter for screening mammogram for malignant neoplasm of breast: Secondary | ICD-10-CM | POA: Insufficient documentation

## 2012-10-31 ENCOUNTER — Ambulatory Visit (INDEPENDENT_AMBULATORY_CARE_PROVIDER_SITE_OTHER): Payer: BC Managed Care – PPO | Admitting: Ophthalmology

## 2012-10-31 DIAGNOSIS — H35059 Retinal neovascularization, unspecified, unspecified eye: Secondary | ICD-10-CM

## 2012-10-31 DIAGNOSIS — H43819 Vitreous degeneration, unspecified eye: Secondary | ICD-10-CM

## 2012-10-31 DIAGNOSIS — H35039 Hypertensive retinopathy, unspecified eye: Secondary | ICD-10-CM

## 2012-10-31 DIAGNOSIS — H2510 Age-related nuclear cataract, unspecified eye: Secondary | ICD-10-CM

## 2012-10-31 DIAGNOSIS — I10 Essential (primary) hypertension: Secondary | ICD-10-CM

## 2012-10-31 DIAGNOSIS — H3530 Unspecified macular degeneration: Secondary | ICD-10-CM

## 2012-11-09 ENCOUNTER — Ambulatory Visit (INDEPENDENT_AMBULATORY_CARE_PROVIDER_SITE_OTHER): Payer: Medicare PPO | Admitting: Ophthalmology

## 2012-11-09 DIAGNOSIS — H35059 Retinal neovascularization, unspecified, unspecified eye: Secondary | ICD-10-CM

## 2012-11-20 ENCOUNTER — Ambulatory Visit (INDEPENDENT_AMBULATORY_CARE_PROVIDER_SITE_OTHER): Payer: BC Managed Care – PPO | Admitting: Ophthalmology

## 2012-12-11 ENCOUNTER — Encounter (INDEPENDENT_AMBULATORY_CARE_PROVIDER_SITE_OTHER): Payer: Medicare PPO | Admitting: Ophthalmology

## 2012-12-11 DIAGNOSIS — H35059 Retinal neovascularization, unspecified, unspecified eye: Secondary | ICD-10-CM

## 2013-03-12 ENCOUNTER — Encounter (INDEPENDENT_AMBULATORY_CARE_PROVIDER_SITE_OTHER): Payer: Medicare Other | Admitting: Ophthalmology

## 2013-03-12 DIAGNOSIS — H43819 Vitreous degeneration, unspecified eye: Secondary | ICD-10-CM

## 2013-03-12 DIAGNOSIS — I10 Essential (primary) hypertension: Secondary | ICD-10-CM

## 2013-03-12 DIAGNOSIS — H35039 Hypertensive retinopathy, unspecified eye: Secondary | ICD-10-CM

## 2013-03-12 DIAGNOSIS — H3530 Unspecified macular degeneration: Secondary | ICD-10-CM

## 2013-03-12 DIAGNOSIS — H2510 Age-related nuclear cataract, unspecified eye: Secondary | ICD-10-CM

## 2013-04-23 ENCOUNTER — Encounter (INDEPENDENT_AMBULATORY_CARE_PROVIDER_SITE_OTHER): Payer: Medicare Other | Admitting: Ophthalmology

## 2013-04-23 DIAGNOSIS — H2510 Age-related nuclear cataract, unspecified eye: Secondary | ICD-10-CM

## 2013-04-23 DIAGNOSIS — I10 Essential (primary) hypertension: Secondary | ICD-10-CM

## 2013-04-23 DIAGNOSIS — H43819 Vitreous degeneration, unspecified eye: Secondary | ICD-10-CM

## 2013-04-23 DIAGNOSIS — H35039 Hypertensive retinopathy, unspecified eye: Secondary | ICD-10-CM

## 2013-04-23 DIAGNOSIS — H3530 Unspecified macular degeneration: Secondary | ICD-10-CM

## 2013-04-30 ENCOUNTER — Encounter (INDEPENDENT_AMBULATORY_CARE_PROVIDER_SITE_OTHER): Payer: Medicare Other | Admitting: Ophthalmology

## 2013-04-30 DIAGNOSIS — H3530 Unspecified macular degeneration: Secondary | ICD-10-CM

## 2013-04-30 DIAGNOSIS — I10 Essential (primary) hypertension: Secondary | ICD-10-CM

## 2013-04-30 DIAGNOSIS — H35039 Hypertensive retinopathy, unspecified eye: Secondary | ICD-10-CM

## 2013-04-30 DIAGNOSIS — H353290 Exudative age-related macular degeneration, unspecified eye, stage unspecified: Secondary | ICD-10-CM

## 2013-04-30 DIAGNOSIS — H2510 Age-related nuclear cataract, unspecified eye: Secondary | ICD-10-CM

## 2013-04-30 DIAGNOSIS — H43819 Vitreous degeneration, unspecified eye: Secondary | ICD-10-CM

## 2013-05-15 ENCOUNTER — Encounter (INDEPENDENT_AMBULATORY_CARE_PROVIDER_SITE_OTHER): Payer: Medicare Other | Admitting: Ophthalmology

## 2013-05-30 ENCOUNTER — Encounter (INDEPENDENT_AMBULATORY_CARE_PROVIDER_SITE_OTHER): Payer: Medicare Other | Admitting: Ophthalmology

## 2013-05-30 DIAGNOSIS — H35039 Hypertensive retinopathy, unspecified eye: Secondary | ICD-10-CM

## 2013-05-30 DIAGNOSIS — H353290 Exudative age-related macular degeneration, unspecified eye, stage unspecified: Secondary | ICD-10-CM

## 2013-05-30 DIAGNOSIS — I10 Essential (primary) hypertension: Secondary | ICD-10-CM

## 2013-05-30 DIAGNOSIS — H43819 Vitreous degeneration, unspecified eye: Secondary | ICD-10-CM

## 2013-05-30 DIAGNOSIS — H2510 Age-related nuclear cataract, unspecified eye: Secondary | ICD-10-CM

## 2013-06-27 ENCOUNTER — Encounter (INDEPENDENT_AMBULATORY_CARE_PROVIDER_SITE_OTHER): Payer: Medicare Other | Admitting: Ophthalmology

## 2013-06-27 DIAGNOSIS — H2510 Age-related nuclear cataract, unspecified eye: Secondary | ICD-10-CM

## 2013-06-27 DIAGNOSIS — H3530 Unspecified macular degeneration: Secondary | ICD-10-CM

## 2013-06-27 DIAGNOSIS — I10 Essential (primary) hypertension: Secondary | ICD-10-CM

## 2013-06-27 DIAGNOSIS — H35039 Hypertensive retinopathy, unspecified eye: Secondary | ICD-10-CM

## 2013-06-27 DIAGNOSIS — H43819 Vitreous degeneration, unspecified eye: Secondary | ICD-10-CM

## 2013-06-27 DIAGNOSIS — H353290 Exudative age-related macular degeneration, unspecified eye, stage unspecified: Secondary | ICD-10-CM

## 2013-07-25 ENCOUNTER — Encounter (INDEPENDENT_AMBULATORY_CARE_PROVIDER_SITE_OTHER): Payer: Medicare Other | Admitting: Ophthalmology

## 2013-07-25 DIAGNOSIS — H43819 Vitreous degeneration, unspecified eye: Secondary | ICD-10-CM

## 2013-07-25 DIAGNOSIS — H35039 Hypertensive retinopathy, unspecified eye: Secondary | ICD-10-CM

## 2013-07-25 DIAGNOSIS — H2510 Age-related nuclear cataract, unspecified eye: Secondary | ICD-10-CM

## 2013-07-25 DIAGNOSIS — H353290 Exudative age-related macular degeneration, unspecified eye, stage unspecified: Secondary | ICD-10-CM

## 2013-07-25 DIAGNOSIS — I10 Essential (primary) hypertension: Secondary | ICD-10-CM

## 2013-08-29 ENCOUNTER — Encounter (INDEPENDENT_AMBULATORY_CARE_PROVIDER_SITE_OTHER): Payer: Medicare Other | Admitting: Ophthalmology

## 2013-08-29 DIAGNOSIS — H2510 Age-related nuclear cataract, unspecified eye: Secondary | ICD-10-CM

## 2013-08-29 DIAGNOSIS — H43819 Vitreous degeneration, unspecified eye: Secondary | ICD-10-CM

## 2013-08-29 DIAGNOSIS — H35039 Hypertensive retinopathy, unspecified eye: Secondary | ICD-10-CM

## 2013-08-29 DIAGNOSIS — I10 Essential (primary) hypertension: Secondary | ICD-10-CM

## 2013-08-29 DIAGNOSIS — H353290 Exudative age-related macular degeneration, unspecified eye, stage unspecified: Secondary | ICD-10-CM

## 2013-09-17 ENCOUNTER — Ambulatory Visit (INDEPENDENT_AMBULATORY_CARE_PROVIDER_SITE_OTHER): Payer: Medicare Other | Admitting: Ophthalmology

## 2013-10-17 ENCOUNTER — Encounter (INDEPENDENT_AMBULATORY_CARE_PROVIDER_SITE_OTHER): Payer: Medicare Other | Admitting: Ophthalmology

## 2013-10-17 DIAGNOSIS — I10 Essential (primary) hypertension: Secondary | ICD-10-CM

## 2013-10-17 DIAGNOSIS — H43811 Vitreous degeneration, right eye: Secondary | ICD-10-CM

## 2013-10-17 DIAGNOSIS — H3532 Exudative age-related macular degeneration: Secondary | ICD-10-CM

## 2013-10-17 DIAGNOSIS — H35031 Hypertensive retinopathy, right eye: Secondary | ICD-10-CM

## 2013-12-05 ENCOUNTER — Encounter (INDEPENDENT_AMBULATORY_CARE_PROVIDER_SITE_OTHER): Payer: Medicare Other | Admitting: Ophthalmology

## 2013-12-05 DIAGNOSIS — H35031 Hypertensive retinopathy, right eye: Secondary | ICD-10-CM

## 2013-12-05 DIAGNOSIS — H43811 Vitreous degeneration, right eye: Secondary | ICD-10-CM

## 2013-12-05 DIAGNOSIS — I10 Essential (primary) hypertension: Secondary | ICD-10-CM

## 2013-12-05 DIAGNOSIS — H3532 Exudative age-related macular degeneration: Secondary | ICD-10-CM

## 2014-01-30 ENCOUNTER — Encounter (INDEPENDENT_AMBULATORY_CARE_PROVIDER_SITE_OTHER): Payer: Medicare Other | Admitting: Ophthalmology

## 2014-01-30 DIAGNOSIS — H3532 Exudative age-related macular degeneration: Secondary | ICD-10-CM

## 2014-04-10 ENCOUNTER — Encounter (INDEPENDENT_AMBULATORY_CARE_PROVIDER_SITE_OTHER): Payer: Medicare Other | Admitting: Ophthalmology

## 2014-04-10 DIAGNOSIS — H3532 Exudative age-related macular degeneration: Secondary | ICD-10-CM

## 2014-04-10 DIAGNOSIS — H43811 Vitreous degeneration, right eye: Secondary | ICD-10-CM

## 2014-04-10 DIAGNOSIS — I10 Essential (primary) hypertension: Secondary | ICD-10-CM

## 2014-04-10 DIAGNOSIS — H35031 Hypertensive retinopathy, right eye: Secondary | ICD-10-CM

## 2014-04-11 ENCOUNTER — Encounter (INDEPENDENT_AMBULATORY_CARE_PROVIDER_SITE_OTHER): Payer: Medicare Other | Admitting: Ophthalmology

## 2014-04-11 DIAGNOSIS — H43811 Vitreous degeneration, right eye: Secondary | ICD-10-CM

## 2014-04-11 DIAGNOSIS — H3532 Exudative age-related macular degeneration: Secondary | ICD-10-CM

## 2014-04-11 DIAGNOSIS — I10 Essential (primary) hypertension: Secondary | ICD-10-CM

## 2014-04-11 DIAGNOSIS — H35031 Hypertensive retinopathy, right eye: Secondary | ICD-10-CM | POA: Diagnosis not present

## 2014-07-03 ENCOUNTER — Encounter (INDEPENDENT_AMBULATORY_CARE_PROVIDER_SITE_OTHER): Payer: Medicare Other | Admitting: Ophthalmology

## 2014-07-03 DIAGNOSIS — I10 Essential (primary) hypertension: Secondary | ICD-10-CM | POA: Diagnosis not present

## 2014-07-03 DIAGNOSIS — H2513 Age-related nuclear cataract, bilateral: Secondary | ICD-10-CM | POA: Diagnosis not present

## 2014-07-03 DIAGNOSIS — H3532 Exudative age-related macular degeneration: Secondary | ICD-10-CM

## 2014-07-03 DIAGNOSIS — H35031 Hypertensive retinopathy, right eye: Secondary | ICD-10-CM | POA: Diagnosis not present

## 2014-07-03 DIAGNOSIS — H4313 Vitreous hemorrhage, bilateral: Secondary | ICD-10-CM | POA: Diagnosis not present

## 2014-10-09 ENCOUNTER — Encounter (INDEPENDENT_AMBULATORY_CARE_PROVIDER_SITE_OTHER): Payer: Medicare Other | Admitting: Ophthalmology

## 2014-10-09 DIAGNOSIS — I10 Essential (primary) hypertension: Secondary | ICD-10-CM | POA: Diagnosis not present

## 2014-10-09 DIAGNOSIS — H353211 Exudative age-related macular degeneration, right eye, with active choroidal neovascularization: Secondary | ICD-10-CM

## 2014-10-09 DIAGNOSIS — H35031 Hypertensive retinopathy, right eye: Secondary | ICD-10-CM

## 2014-10-09 DIAGNOSIS — H2513 Age-related nuclear cataract, bilateral: Secondary | ICD-10-CM

## 2014-10-09 DIAGNOSIS — H43811 Vitreous degeneration, right eye: Secondary | ICD-10-CM | POA: Diagnosis not present

## 2015-01-22 ENCOUNTER — Encounter (INDEPENDENT_AMBULATORY_CARE_PROVIDER_SITE_OTHER): Payer: Medicare Other | Admitting: Ophthalmology

## 2015-01-22 DIAGNOSIS — H2513 Age-related nuclear cataract, bilateral: Secondary | ICD-10-CM | POA: Diagnosis not present

## 2015-01-22 DIAGNOSIS — I10 Essential (primary) hypertension: Secondary | ICD-10-CM

## 2015-01-22 DIAGNOSIS — H43813 Vitreous degeneration, bilateral: Secondary | ICD-10-CM

## 2015-01-22 DIAGNOSIS — H353211 Exudative age-related macular degeneration, right eye, with active choroidal neovascularization: Secondary | ICD-10-CM

## 2015-01-22 DIAGNOSIS — H35031 Hypertensive retinopathy, right eye: Secondary | ICD-10-CM | POA: Diagnosis not present

## 2015-05-14 ENCOUNTER — Encounter (INDEPENDENT_AMBULATORY_CARE_PROVIDER_SITE_OTHER): Payer: Medicare Other | Admitting: Ophthalmology

## 2015-05-14 DIAGNOSIS — I10 Essential (primary) hypertension: Secondary | ICD-10-CM

## 2015-05-14 DIAGNOSIS — H43811 Vitreous degeneration, right eye: Secondary | ICD-10-CM | POA: Diagnosis not present

## 2015-05-14 DIAGNOSIS — H2513 Age-related nuclear cataract, bilateral: Secondary | ICD-10-CM | POA: Diagnosis not present

## 2015-05-14 DIAGNOSIS — H353211 Exudative age-related macular degeneration, right eye, with active choroidal neovascularization: Secondary | ICD-10-CM

## 2015-05-14 DIAGNOSIS — H35033 Hypertensive retinopathy, bilateral: Secondary | ICD-10-CM | POA: Diagnosis not present

## 2015-09-03 ENCOUNTER — Encounter (INDEPENDENT_AMBULATORY_CARE_PROVIDER_SITE_OTHER): Payer: Medicare Other | Admitting: Ophthalmology

## 2015-09-03 DIAGNOSIS — H353211 Exudative age-related macular degeneration, right eye, with active choroidal neovascularization: Secondary | ICD-10-CM

## 2015-09-03 DIAGNOSIS — I10 Essential (primary) hypertension: Secondary | ICD-10-CM

## 2015-09-03 DIAGNOSIS — H35033 Hypertensive retinopathy, bilateral: Secondary | ICD-10-CM | POA: Diagnosis not present

## 2015-09-03 DIAGNOSIS — H2513 Age-related nuclear cataract, bilateral: Secondary | ICD-10-CM

## 2015-09-03 DIAGNOSIS — H43811 Vitreous degeneration, right eye: Secondary | ICD-10-CM | POA: Diagnosis not present

## 2015-12-24 ENCOUNTER — Encounter (INDEPENDENT_AMBULATORY_CARE_PROVIDER_SITE_OTHER): Payer: Medicare Other | Admitting: Ophthalmology

## 2015-12-24 DIAGNOSIS — H353211 Exudative age-related macular degeneration, right eye, with active choroidal neovascularization: Secondary | ICD-10-CM | POA: Diagnosis not present

## 2015-12-24 DIAGNOSIS — H43813 Vitreous degeneration, bilateral: Secondary | ICD-10-CM

## 2015-12-24 DIAGNOSIS — I10 Essential (primary) hypertension: Secondary | ICD-10-CM | POA: Diagnosis not present

## 2015-12-24 DIAGNOSIS — H35033 Hypertensive retinopathy, bilateral: Secondary | ICD-10-CM

## 2016-04-14 ENCOUNTER — Encounter (INDEPENDENT_AMBULATORY_CARE_PROVIDER_SITE_OTHER): Payer: Medicare Other | Admitting: Ophthalmology

## 2016-04-14 DIAGNOSIS — H43811 Vitreous degeneration, right eye: Secondary | ICD-10-CM

## 2016-04-14 DIAGNOSIS — H35031 Hypertensive retinopathy, right eye: Secondary | ICD-10-CM | POA: Diagnosis not present

## 2016-04-14 DIAGNOSIS — H353211 Exudative age-related macular degeneration, right eye, with active choroidal neovascularization: Secondary | ICD-10-CM

## 2016-04-14 DIAGNOSIS — H2513 Age-related nuclear cataract, bilateral: Secondary | ICD-10-CM

## 2016-04-14 DIAGNOSIS — I10 Essential (primary) hypertension: Secondary | ICD-10-CM | POA: Diagnosis not present

## 2016-07-21 ENCOUNTER — Encounter (INDEPENDENT_AMBULATORY_CARE_PROVIDER_SITE_OTHER): Payer: Medicare Other | Admitting: Ophthalmology

## 2016-07-21 DIAGNOSIS — H353211 Exudative age-related macular degeneration, right eye, with active choroidal neovascularization: Secondary | ICD-10-CM

## 2016-07-21 DIAGNOSIS — H43811 Vitreous degeneration, right eye: Secondary | ICD-10-CM

## 2016-07-21 DIAGNOSIS — H2513 Age-related nuclear cataract, bilateral: Secondary | ICD-10-CM | POA: Diagnosis not present

## 2016-07-21 DIAGNOSIS — H35033 Hypertensive retinopathy, bilateral: Secondary | ICD-10-CM | POA: Diagnosis not present

## 2016-07-21 DIAGNOSIS — I10 Essential (primary) hypertension: Secondary | ICD-10-CM | POA: Diagnosis not present

## 2016-10-27 ENCOUNTER — Encounter (INDEPENDENT_AMBULATORY_CARE_PROVIDER_SITE_OTHER): Payer: Medicare Other | Admitting: Ophthalmology

## 2016-10-27 DIAGNOSIS — H35033 Hypertensive retinopathy, bilateral: Secondary | ICD-10-CM | POA: Diagnosis not present

## 2016-10-27 DIAGNOSIS — H43813 Vitreous degeneration, bilateral: Secondary | ICD-10-CM

## 2016-10-27 DIAGNOSIS — H353211 Exudative age-related macular degeneration, right eye, with active choroidal neovascularization: Secondary | ICD-10-CM | POA: Diagnosis not present

## 2016-10-27 DIAGNOSIS — H2513 Age-related nuclear cataract, bilateral: Secondary | ICD-10-CM

## 2016-10-27 DIAGNOSIS — I10 Essential (primary) hypertension: Secondary | ICD-10-CM

## 2017-02-02 ENCOUNTER — Encounter (INDEPENDENT_AMBULATORY_CARE_PROVIDER_SITE_OTHER): Payer: Medicare Other | Admitting: Ophthalmology

## 2017-02-07 ENCOUNTER — Encounter (INDEPENDENT_AMBULATORY_CARE_PROVIDER_SITE_OTHER): Payer: Medicare Other | Admitting: Ophthalmology

## 2017-02-07 DIAGNOSIS — H35031 Hypertensive retinopathy, right eye: Secondary | ICD-10-CM | POA: Diagnosis not present

## 2017-02-07 DIAGNOSIS — I10 Essential (primary) hypertension: Secondary | ICD-10-CM | POA: Diagnosis not present

## 2017-02-07 DIAGNOSIS — H43811 Vitreous degeneration, right eye: Secondary | ICD-10-CM

## 2017-02-07 DIAGNOSIS — H353211 Exudative age-related macular degeneration, right eye, with active choroidal neovascularization: Secondary | ICD-10-CM | POA: Diagnosis not present

## 2017-02-07 DIAGNOSIS — H2513 Age-related nuclear cataract, bilateral: Secondary | ICD-10-CM | POA: Diagnosis not present

## 2017-05-18 ENCOUNTER — Encounter (INDEPENDENT_AMBULATORY_CARE_PROVIDER_SITE_OTHER): Payer: Medicare Other | Admitting: Ophthalmology

## 2017-05-18 DIAGNOSIS — I10 Essential (primary) hypertension: Secondary | ICD-10-CM | POA: Diagnosis not present

## 2017-05-18 DIAGNOSIS — H43811 Vitreous degeneration, right eye: Secondary | ICD-10-CM

## 2017-05-18 DIAGNOSIS — H35033 Hypertensive retinopathy, bilateral: Secondary | ICD-10-CM

## 2017-05-18 DIAGNOSIS — H2513 Age-related nuclear cataract, bilateral: Secondary | ICD-10-CM | POA: Diagnosis not present

## 2017-05-18 DIAGNOSIS — H353211 Exudative age-related macular degeneration, right eye, with active choroidal neovascularization: Secondary | ICD-10-CM | POA: Diagnosis not present

## 2017-09-07 ENCOUNTER — Encounter (INDEPENDENT_AMBULATORY_CARE_PROVIDER_SITE_OTHER): Payer: Medicare Other | Admitting: Ophthalmology

## 2017-09-07 DIAGNOSIS — H2513 Age-related nuclear cataract, bilateral: Secondary | ICD-10-CM

## 2017-09-07 DIAGNOSIS — I10 Essential (primary) hypertension: Secondary | ICD-10-CM | POA: Diagnosis not present

## 2017-09-07 DIAGNOSIS — H353211 Exudative age-related macular degeneration, right eye, with active choroidal neovascularization: Secondary | ICD-10-CM | POA: Diagnosis not present

## 2017-09-07 DIAGNOSIS — H35031 Hypertensive retinopathy, right eye: Secondary | ICD-10-CM

## 2017-09-07 DIAGNOSIS — H43812 Vitreous degeneration, left eye: Secondary | ICD-10-CM | POA: Diagnosis not present

## 2017-11-16 ENCOUNTER — Other Ambulatory Visit: Payer: Self-pay | Admitting: Family Medicine

## 2017-11-16 DIAGNOSIS — Z1231 Encounter for screening mammogram for malignant neoplasm of breast: Secondary | ICD-10-CM

## 2018-01-11 ENCOUNTER — Encounter (INDEPENDENT_AMBULATORY_CARE_PROVIDER_SITE_OTHER): Payer: Medicare Other | Admitting: Ophthalmology

## 2018-01-11 DIAGNOSIS — H353211 Exudative age-related macular degeneration, right eye, with active choroidal neovascularization: Secondary | ICD-10-CM | POA: Diagnosis not present

## 2018-01-11 DIAGNOSIS — I10 Essential (primary) hypertension: Secondary | ICD-10-CM | POA: Diagnosis not present

## 2018-01-11 DIAGNOSIS — H43813 Vitreous degeneration, bilateral: Secondary | ICD-10-CM

## 2018-01-11 DIAGNOSIS — H2513 Age-related nuclear cataract, bilateral: Secondary | ICD-10-CM

## 2018-01-11 DIAGNOSIS — H35033 Hypertensive retinopathy, bilateral: Secondary | ICD-10-CM | POA: Diagnosis not present

## 2018-03-08 ENCOUNTER — Encounter (INDEPENDENT_AMBULATORY_CARE_PROVIDER_SITE_OTHER): Payer: Medicare Other | Admitting: Ophthalmology

## 2018-03-08 DIAGNOSIS — H353211 Exudative age-related macular degeneration, right eye, with active choroidal neovascularization: Secondary | ICD-10-CM

## 2018-03-08 DIAGNOSIS — H43811 Vitreous degeneration, right eye: Secondary | ICD-10-CM

## 2018-03-08 DIAGNOSIS — I10 Essential (primary) hypertension: Secondary | ICD-10-CM | POA: Diagnosis not present

## 2018-03-08 DIAGNOSIS — H2513 Age-related nuclear cataract, bilateral: Secondary | ICD-10-CM

## 2018-03-08 DIAGNOSIS — H35033 Hypertensive retinopathy, bilateral: Secondary | ICD-10-CM | POA: Diagnosis not present

## 2018-05-24 ENCOUNTER — Other Ambulatory Visit: Payer: Self-pay

## 2018-05-24 ENCOUNTER — Encounter (INDEPENDENT_AMBULATORY_CARE_PROVIDER_SITE_OTHER): Payer: Medicare Other | Admitting: Ophthalmology

## 2018-05-24 DIAGNOSIS — H353211 Exudative age-related macular degeneration, right eye, with active choroidal neovascularization: Secondary | ICD-10-CM

## 2018-05-24 DIAGNOSIS — I10 Essential (primary) hypertension: Secondary | ICD-10-CM | POA: Diagnosis not present

## 2018-05-24 DIAGNOSIS — H43811 Vitreous degeneration, right eye: Secondary | ICD-10-CM | POA: Diagnosis not present

## 2018-05-24 DIAGNOSIS — H2513 Age-related nuclear cataract, bilateral: Secondary | ICD-10-CM

## 2018-05-24 DIAGNOSIS — H35033 Hypertensive retinopathy, bilateral: Secondary | ICD-10-CM | POA: Diagnosis not present

## 2018-08-09 ENCOUNTER — Encounter (INDEPENDENT_AMBULATORY_CARE_PROVIDER_SITE_OTHER): Payer: Medicare Other | Admitting: Ophthalmology

## 2018-08-09 ENCOUNTER — Other Ambulatory Visit: Payer: Self-pay

## 2018-08-09 DIAGNOSIS — I10 Essential (primary) hypertension: Secondary | ICD-10-CM | POA: Diagnosis not present

## 2018-08-09 DIAGNOSIS — H2513 Age-related nuclear cataract, bilateral: Secondary | ICD-10-CM

## 2018-08-09 DIAGNOSIS — H35033 Hypertensive retinopathy, bilateral: Secondary | ICD-10-CM

## 2018-08-09 DIAGNOSIS — H353211 Exudative age-related macular degeneration, right eye, with active choroidal neovascularization: Secondary | ICD-10-CM | POA: Diagnosis not present

## 2018-08-09 DIAGNOSIS — H43811 Vitreous degeneration, right eye: Secondary | ICD-10-CM

## 2018-10-25 ENCOUNTER — Encounter (INDEPENDENT_AMBULATORY_CARE_PROVIDER_SITE_OTHER): Payer: Medicare Other | Admitting: Ophthalmology

## 2018-10-25 DIAGNOSIS — H35033 Hypertensive retinopathy, bilateral: Secondary | ICD-10-CM

## 2018-10-25 DIAGNOSIS — I10 Essential (primary) hypertension: Secondary | ICD-10-CM

## 2018-10-25 DIAGNOSIS — H353211 Exudative age-related macular degeneration, right eye, with active choroidal neovascularization: Secondary | ICD-10-CM | POA: Diagnosis not present

## 2018-10-25 DIAGNOSIS — H2513 Age-related nuclear cataract, bilateral: Secondary | ICD-10-CM

## 2018-10-25 DIAGNOSIS — H43811 Vitreous degeneration, right eye: Secondary | ICD-10-CM

## 2018-11-07 ENCOUNTER — Encounter: Payer: Self-pay | Admitting: Family Medicine

## 2018-11-08 ENCOUNTER — Encounter: Payer: Self-pay | Admitting: Internal Medicine

## 2018-11-27 ENCOUNTER — Ambulatory Visit: Payer: Medicare Other | Admitting: Nurse Practitioner

## 2019-01-31 ENCOUNTER — Encounter (INDEPENDENT_AMBULATORY_CARE_PROVIDER_SITE_OTHER): Payer: Medicare Other | Admitting: Ophthalmology

## 2019-01-31 DIAGNOSIS — I10 Essential (primary) hypertension: Secondary | ICD-10-CM | POA: Diagnosis not present

## 2019-01-31 DIAGNOSIS — H2513 Age-related nuclear cataract, bilateral: Secondary | ICD-10-CM

## 2019-01-31 DIAGNOSIS — H43813 Vitreous degeneration, bilateral: Secondary | ICD-10-CM

## 2019-01-31 DIAGNOSIS — H35033 Hypertensive retinopathy, bilateral: Secondary | ICD-10-CM | POA: Diagnosis not present

## 2019-01-31 DIAGNOSIS — H353211 Exudative age-related macular degeneration, right eye, with active choroidal neovascularization: Secondary | ICD-10-CM | POA: Diagnosis not present

## 2019-04-16 ENCOUNTER — Telehealth: Payer: Self-pay

## 2019-04-16 ENCOUNTER — Ambulatory Visit: Payer: Medicare Other | Admitting: Cardiovascular Disease

## 2019-04-16 ENCOUNTER — Encounter: Payer: Self-pay | Admitting: Cardiovascular Disease

## 2019-04-16 ENCOUNTER — Other Ambulatory Visit: Payer: Self-pay

## 2019-04-16 VITALS — BP 140/68 | HR 89 | Temp 98.4°F | Ht 65.0 in | Wt 180.0 lb

## 2019-04-16 DIAGNOSIS — R5383 Other fatigue: Secondary | ICD-10-CM

## 2019-04-16 DIAGNOSIS — I10 Essential (primary) hypertension: Secondary | ICD-10-CM

## 2019-04-16 DIAGNOSIS — R55 Syncope and collapse: Secondary | ICD-10-CM

## 2019-04-16 DIAGNOSIS — R0602 Shortness of breath: Secondary | ICD-10-CM

## 2019-04-16 DIAGNOSIS — Z720 Tobacco use: Secondary | ICD-10-CM

## 2019-04-16 NOTE — Progress Notes (Signed)
CARDIOLOGY CONSULT NOTE  Patient ID: Alisha Shaw MRN: [id number] DOB/AGE: 07/23/1950 69 y.o.  Admit date: (Not on file) Primary Physician: Leanna Sato, MD  Reason for Consultation: Syncope  HPI: Alisha Shaw is a 69 y.o. female who is being seen today for the evaluation of an copy at the request of Leanna Sato, MD.  Past medical history includes hypertension, tobacco abuse, COPD, and hyperlipidemia.  I personally reviewed records from her PCP.  She apparently had an ECG performed there which is not available to me at this time.  She has had episodic palpitations.  She also experienced a syncopal episode in late February 2021.  She had some nausea and vomiting after she passed out.  She denies any chest pain afterwards but did have some palpitations.  Upon speaking with her further, she told me she was walking from her living room to the kitchen and then felt like her legs were getting weak and then she passed out.  Her husband heard her fall and came in.  She believes she was out for a very brief period of time.  She denies antecedent chest pain.  She felt nauseous and vomited afterwards.  Since that time she has had 2 episodes of what she describes as "an erratic heart rhythm "with each episode lasting for about an hour.  She describes exertional fatigue which has been going on for the past 6 months.  She also describes bilateral hip and knee pain.  She started smoking at the age of 61.  For the past 4 years she has been smoking 3 packs of cigarettes daily and prior to this she was smoking anywhere between a pack and 2 packs of cigarettes daily.  I personally reviewed the ECG performed today which demonstrates sinus rhythm with nonspecific ST segment abnormalities.  Family history: Brother underwent CABG at age 12.   No Known Allergies  Current Outpatient Medications  Medication Sig Dispense Refill  . butalbital-aspirin-caffeine (BUTALBITAL COMPOUND/ASA) 50-325-40  MG per capsule Take 1 capsule by mouth 2 (two) times daily.    . Chlorpheniramine-PSE-Ibuprofen (ADVIL ALLERGY SINUS) 2-30-200 MG TABS Take 2 tablets by mouth every 8 (eight) hours as needed (Sinus Pain).    Marland Kitchen FLUoxetine (PROZAC) 40 MG capsule Take 40 mg by mouth daily.    . homatropine 5 % ophthalmic solution Place 1 drop into the left eye every other [place].    . hydrochlorothiazide (HYDRODIURIL) 25 MG tablet Take 25 mg by mouth daily.    Marland Kitchen losartan (COZAAR) 50 MG tablet Take 50 mg by mouth daily.    . Multiple Vitamins-Minerals (PRESERVISION AREDS 2 PO) Take 1 capsule by mouth 2 (two) times daily.    . prednisoLONE acetate (PRED FORTE) 1 % ophthalmic suspension Place 1 drop into the left eye every other [place].    . ranitidine (ZANTAC) 75 MG tablet Take 75 mg by mouth 2 (two) times daily.    . simvastatin (ZOCOR) 40 MG tablet Take 40 mg by mouth every evening.     No current facility-administered medications for this visit.    Past Medical History:  Diagnosis Date  . Anxiety   . Depression   . GERD (gastroesophageal reflux disease)    indigestion at times not related to food  . Headache(784.0)    history of migranes  . Hypertension     Past Surgical History:  Procedure Laterality Date  . ABDOMINAL HYSTERECTOMY  1995  . APPENDECTOMY  removed when had gallbladder surgery  . CHOLECYSTECTOMY  1976   [place]  . EYE SURGERY  2012   [place].  Marland Kitchen HEMORRHOID SURGERY N/A 07/17/2012   Procedure: HEMORRHOIDECTOMY;  Surgeon: Dalia Heading, MD;  Location: [facility];  Service: General;  Laterality: N/A;  . spider bite Left 2011   brown recluse spider bite    Social History   Socioeconomic History  . Marital status: Married    Spouse name: Not on file  . Number of children: Not on file  . Years of education: Not on file  . Highest education level: Not on file  Occupational History  . Not on file  Tobacco Use  . Smoking status: Current Every [place] Smoker    Packs/Cuffie: 2.50     Years: 50.00    Pack years: 125.00    Types: Cigarettes  Substance and Sexual Activity  . Alcohol use: Yes    Comment: socially  . Drug use: No  . Sexual activity: Never  Other Topics Concern  . Not on file  Social History Narrative  . Not on file   Social Determinants of Health   Financial Resource Strain:   . Difficulty of Paying Living Expenses:   Food Insecurity:   . Worried About Programme researcher, broadcasting/film/video in the Last Year:   . Barista in the Last Year:   Transportation Needs:   . Freight forwarder (Medical):   Marland Kitchen Lack of Transportation (Non-Medical):   Physical Activity:   . Days of Exercise per Week:   . Minutes of Exercise per Session:   Stress:   . Feeling of Stress :   Social Connections:   . Frequency of Communication with Friends and Family:   . Frequency of Social Gatherings with Friends and Family:   . Attends Religious Services:   . Active Member of Clubs or Organizations:   . Attends Banker Meetings:   Marland Kitchen Marital Status:   Intimate Partner Violence:   . Fear of Current or Ex-Partner:   . Emotionally Abused:   Marland Kitchen Physically Abused:   . Sexually Abused:       No outpatient medications have been marked as taking for the 04/16/19 encounter (Office Visit) with Laqueta Linden, MD.      Review of systems complete and found to be negative unless listed above in HPI   Marlyn Corporal, RN was present throughout the entirety of the encounter.  Physical exam Blood pressure 140/68, pulse 89, temperature 98.4 F (36.9 C), height 5\' 5"  (1.651 m), weight 180 lb (81.6 kg), SpO2 97 %. General: NAD Neck: No JVD, no thyromegaly or thyroid nodule.  Lungs: Diffusely diminished breath sounds bilaterally with some coarse sounds bilaterally, no wheezes. CV: Nondisplaced PMI. Regular rate and rhythm, normal S1/S2, no S3/S4, no murmur.  No peripheral edema.  No carotid bruit.    Abdomen: Soft, nontender, no distention.  Skin: Intact without  lesions or rashes.  Neurologic: Alert and oriented x 3.  Psych: Normal affect. Extremities: No clubbing or cyanosis.  HEENT: Normal.   ECG: Most recent ECG reviewed.   Labs: Lab Results  Component Value Date/Time   K 3.9 07/17/2012 08:35 AM   BUN 19 07/13/2012 10:30 AM   CREATININE 0.64 07/13/2012 10:30 AM   HGB 11.2 (L) 07/17/2012 08:35 AM     Lipids: No results found for: LDLCALC, LDLDIRECT, CHOL, TRIG, HDL      ASSESSMENT AND PLAN:   1.  Syncope and palpitations: Unclear etiology.  She has several cardiovascular risk factors.  I will obtain a 30-Berninger event monitor.  2.  Hypertension: BP is mildly elevated.  This will need further monitoring.  3.  Exertional fatigue/shortness of breath: She certainly has some degree of COPD and has a long history of tobacco abuse.  In order to rule out an ischemic etiology, I will obtain a Lexiscan Myoview. I will order a 2-D echocardiogram with Doppler to evaluate cardiac structure, function, and regional wall motion.  4.  Tobacco abuse: She has been smoking since the age of 35 and is currently smoking 3 packs of cigarettes daily.  She needs complete cessation.    Disposition: Follow up in 3 months virtual visit  Signed: Kate Sable, M.D., F.A.C.C.  04/16/2019, 1:28 PM

## 2019-04-16 NOTE — Telephone Encounter (Signed)
  Patient Consent for Virtual Visit         Alisha Shaw has provided verbal consent on 04/16/2019 for a virtual visit (video or telephone).   CONSENT FOR VIRTUAL VISIT FOR:  Alisha Shaw  By participating in this virtual visit I agree to the following:  I hereby voluntarily request, consent and authorize [facility] and its employed or contracted physicians, physician assistants, nurse practitioners or other licensed health care professionals (the Practitioner), to provide me with telemedicine health care services (the "Services") as deemed necessary by the treating Practitioner. I acknowledge and consent to receive the Services by the Practitioner via telemedicine. I understand that the telemedicine visit will involve communicating with the Practitioner through live audiovisual communication technology and the disclosure of certain medical information by electronic transmission. I acknowledge that I have been given the opportunity to request an in-person assessment or other available alternative prior to the telemedicine visit and am voluntarily participating in the telemedicine visit.  I understand that I have the right to withhold or withdraw my consent to the use of telemedicine in the course of my care at any time, without affecting my right to future care or treatment, and that the Practitioner or I may terminate the telemedicine visit at any time. I understand that I have the right to inspect all information obtained and/or recorded in the course of the telemedicine visit and may receive copies of available information for a reasonable fee.  I understand that some of the potential risks of receiving the Services via telemedicine include:  Marland Kitchen Delay or interruption in medical evaluation due to technological equipment failure or disruption; . Information transmitted may not be sufficient (e.g. poor resolution of images) to allow for appropriate medical decision making by the Practitioner;  and/or  . In rare instances, security protocols could fail, causing a breach of personal health information.  Furthermore, I acknowledge that it is my responsibility to provide information about my medical history, conditions and care that is complete and accurate to the best of my ability. I acknowledge that Practitioner's advice, recommendations, and/or decision may be based on factors not within their control, such as incomplete or inaccurate data provided by me or distortions of diagnostic images or specimens that may result from electronic transmissions. I understand that the practice of medicine is not an exact science and that Practitioner makes no warranties or guarantees regarding treatment outcomes. I acknowledge that a copy of this consent can be made available to me via my patient portal [facility] MyChart), or I can request a printed copy by calling the office of [facility].    I understand that my insurance will be billed for this visit.   I have read or had this consent read to me. . I understand the contents of this consent, which adequately explains the benefits and risks of the Services being provided via telemedicine.  . I have been provided ample opportunity to ask questions regarding this consent and the Services and have had my questions answered to my satisfaction. . I give my informed consent for the services to be provided through the use of telemedicine in my medical care

## 2019-04-16 NOTE — Patient Instructions (Signed)
Medication Instructions:  Your physician recommends that you continue on your current medications as directed. Please refer to the Current Medication list given to you today.  *If you need a refill on your cardiac medications before your next appointment, please call your pharmacy*   Lab Work: None today If you have labs (blood work) drawn today and your tests are completely normal, you will receive your results only by: Marland Kitchen MyChart Message (if you have MyChart) OR . A paper copy in the mail If you have any lab test that is abnormal or we need to change your treatment, we will call you to review the results.   Testing/Procedures: Your physician has requested that you have an echocardiogram. Echocardiography is a painless test that uses sound waves to create images of your heart. It provides your doctor with information about the size and shape of your heart and how well your heart's chambers and valves are working. This procedure takes approximately one hour. There are no restrictions for this procedure.   Your physician has requested that you have a lexiscan myoview. For further information please visit [email or web address] Please follow instruction sheet, as given.    Your physician has recommended that you wear an event monitor for 30 days . Event monitors are medical devices that record the heart's electrical activity. Doctors most often [place] these monitors to diagnose arrhythmias. Arrhythmias are problems with the speed or rhythm of the heartbeat. The monitor is a small, portable device. You can wear one while you do your normal daily activities. This is usually used to diagnose what is causing palpitations/syncope (passing out).     Follow-Up: At [facility], you and your health needs are our priority.  As part of our continuing mission to provide you with exceptional heart care, we have created designated Provider Care Teams.  These Care Teams include your primary Cardiologist  (physician) and Advanced Practice Providers (APPs -  Physician Assistants and Nurse Practitioners) who all work together to provide you with the care you need, when you need it.  We recommend signing up for the patient portal called "MyChart".  Sign up information is provided on this After Visit Summary.  MyChart is used to connect with patients for Virtual Visits (Telemedicine).  Patients are able to view lab/test results, encounter notes, upcoming appointments, etc.  Non-urgent messages can be sent to your provider as well.   To learn more about what you can do with MyChart, go to [email or web address].    Your next appointment:   3 month(s)  The format for your next appointment:   Virtual Visit   Provider:   Prentice Docker, MD   Other Instructions None      Thank you for choosing [facility] !

## 2019-04-19 ENCOUNTER — Ambulatory Visit (INDEPENDENT_AMBULATORY_CARE_PROVIDER_SITE_OTHER): Payer: Medicare Other

## 2019-04-19 ENCOUNTER — Other Ambulatory Visit: Payer: Self-pay

## 2019-04-19 DIAGNOSIS — R55 Syncope and collapse: Secondary | ICD-10-CM

## 2019-04-25 NOTE — Addendum Note (Signed)
Addended by: Marlyn Corporal A on: 04/25/2019 08:57 AM   Modules accepted: Orders

## 2019-05-03 ENCOUNTER — Ambulatory Visit (HOSPITAL_COMMUNITY)
Admission: RE | Admit: 2019-05-03 | Discharge: 2019-05-03 | Disposition: A | Discharge status: Home / Self Care | Payer: Medicare Other | Source: Ambulatory Visit | Attending: Cardiovascular Disease | Admitting: Cardiovascular Disease

## 2019-05-03 ENCOUNTER — Other Ambulatory Visit: Payer: Self-pay

## 2019-05-03 ENCOUNTER — Encounter (HOSPITAL_COMMUNITY)
Admission: RE | Admit: 2019-05-03 | Discharge: 2019-05-03 | Disposition: A | Discharge status: Home / Self Care | Payer: Medicare Other | Source: Ambulatory Visit | Attending: Cardiovascular Disease | Admitting: Cardiovascular Disease

## 2019-05-03 ENCOUNTER — Ambulatory Visit (HOSPITAL_BASED_OUTPATIENT_CLINIC_OR_DEPARTMENT_OTHER)
Admission: RE | Admit: 2019-05-03 | Discharge: 2019-05-03 | Disposition: A | Discharge status: Home / Self Care | Payer: Medicare Other | Source: Ambulatory Visit | Attending: Cardiovascular Disease | Admitting: Cardiovascular Disease

## 2019-05-03 DIAGNOSIS — R0602 Shortness of breath: Secondary | ICD-10-CM | POA: Insufficient documentation

## 2019-05-03 DIAGNOSIS — R55 Syncope and collapse: Secondary | ICD-10-CM | POA: Diagnosis present

## 2019-05-03 LAB — NM MYOCAR MULTI W/SPECT W/WALL MOTION / EF
LV dias vol: 49 mL (ref 46–106)
LV sys vol: 13 mL
Peak HR: 113 {beats}/min
RATE: 0.45
Rest HR: 75 {beats}/min
SDS: 1
SRS: 0
SSS: 1
TID: 0.82

## 2019-05-03 MED ORDER — SODIUM CHLORIDE FLUSH 0.9 % IV SOLN
INTRAVENOUS | Status: AC
  Administered 2019-05-03: 10 mL via INTRAVENOUS
  Filled 2019-05-03: qty 10

## 2019-05-03 MED ORDER — TECHNETIUM TC 99M TETROFOSMIN IV KIT
10.0000 | PACK | Freq: Once | INTRAVENOUS | Status: AC | PRN
  Administered 2019-05-03: 10.9 via INTRAVENOUS

## 2019-05-03 MED ORDER — REGADENOSON 0.4 MG/5ML IV SOLN
INTRAVENOUS | Status: AC
  Administered 2019-05-03: 0.4 mg via INTRAVENOUS
  Filled 2019-05-03: qty 5

## 2019-05-03 MED ORDER — TECHNETIUM TC 99M TETROFOSMIN IV KIT
30.0000 | PACK | Freq: Once | INTRAVENOUS | Status: AC | PRN
  Administered 2019-05-03: 30 via INTRAVENOUS

## 2019-05-03 NOTE — Progress Notes (Signed)
*  PRELIMINARY RESULTS* Echocardiogram 2D Echocardiogram has been performed.  Stacey Drain 05/03/2019, 2:23 PM

## 2019-05-03 NOTE — Progress Notes (Signed)
I.V. taken out at 1345. Site clean, dry and intact. 2x2 gauze at site and coban wrap dressing.  Celesta Gentile, RCS

## 2019-05-09 ENCOUNTER — Encounter (INDEPENDENT_AMBULATORY_CARE_PROVIDER_SITE_OTHER): Payer: Medicare Other | Admitting: Ophthalmology

## 2019-05-09 DIAGNOSIS — H353211 Exudative age-related macular degeneration, right eye, with active choroidal neovascularization: Secondary | ICD-10-CM

## 2019-05-09 DIAGNOSIS — H35033 Hypertensive retinopathy, bilateral: Secondary | ICD-10-CM | POA: Diagnosis not present

## 2019-05-09 DIAGNOSIS — H43813 Vitreous degeneration, bilateral: Secondary | ICD-10-CM

## 2019-05-09 DIAGNOSIS — I10 Essential (primary) hypertension: Secondary | ICD-10-CM

## 2019-07-16 ENCOUNTER — Telehealth: Payer: Medicare Other | Admitting: Cardiovascular Disease

## 2019-08-06 ENCOUNTER — Telehealth: Payer: Self-pay | Admitting: Cardiovascular Disease

## 2019-08-06 MED ORDER — METOPROLOL TARTRATE 25 MG PO TABS
12.5000 mg | ORAL_TABLET | Freq: Two times a day (BID) | ORAL | 6 refills | Status: DC | PRN
Start: 2019-08-06 — End: 2019-09-11

## 2019-08-06 NOTE — Telephone Encounter (Signed)
New message    Patient called and left message on vm- she states she seen the APP and they told her that they could prescribe her a medication that would help with her heart skipping a beat.  She feels that her heart is skipping again and would like to try the medication?  Please call

## 2019-08-06 NOTE — Telephone Encounter (Signed)
     She is already on Amlodipine, therefore cannot use Cardizem as they are in the same class of medications. Would recommend using Lopressor 12.5mg  BID PRN. If having to take regularly, can switch to Toprol-XL. She does have a history of COPD by review of notes, so prefer to use a cardioselective beta-blocker.   Thanks,  [place]

## 2019-08-06 NOTE — Telephone Encounter (Signed)
Laqueta Linden, MD  05/28/2019 3:08 PM EDT     Frequent extra beats seen from the top chambers of the heart but no sustained abnormal heart rhythms. If she is experiencing frequent palpitations, I would consider the addition of diltiazem     I will forward to B.Strader,PA-C

## 2019-08-06 NOTE — Telephone Encounter (Signed)
I spoke with patient, will try lopressor 12.5 mg bid

## 2019-08-14 NOTE — H&P (Signed)
Surgical History & Physical  Patient Name: Alisha Shaw DOB: 04-Dec-1950  Surgery: Cataract extraction with intraocular lens implant phacoemulsification; Right Eye  Surgeon: [name] MD Surgery Date:  08/27/2019 Pre-Op Date:  08/06/2019  HPI: A 85 Yr. old female patient is referred by Dr Charise Killian for cataract eval. Hx of AMD OU, getting injections in both eyes for years, last done OD 1 year ago. Pt states left eye is blind due to staff infection after injection in left eye a couple of years ago. 1. The patient complains of difficulty when viewing TV, reading closed caption, news scrolls on TV, which began 6 months ago. The right eye is affected. The episode is gradual. The condition's severity increased since last visit. Symptoms occur when the patient is inside and outside. Pt does not drive at all due tio blurry vision. Pt is having diff reading both due to AMD and cataract per pt. HPI was performed by [name] .  Medical History: Macula Degeneration Cataracts OS: Blindness, [id number] High Blood Pressure Lung Problems  Review of Systems Negative Allergic/Immunologic Negative Cardiovascular Negative Constitutional Negative Ear, Nose, Mouth & Throat Negative Endocrine Negative Eyes Negative Gastrointestinal Negative Genitourinary Negative Hemotologic/Lymphatic Negative Integumentary Negative Musculoskeletal Negative Neurological Negative Psychiatry Negative Respiratory  Social   Current every Stan[place] smoker 1 pack Per Koopman  Medication Blood pressure medicine, Butalibital, Losartan, Zantac,   Sx/Procedures Vitrectomy, Clean out bacteria,  Gallbladder, Hystectomy,   Drug Allergies   NKDA  History & Physical: Heent:  Cataract, Right eye NECK: supple without bruits LUNGS: lungs clear to auscultation CV: regular rate and rhythm Abdomen: soft and non-tender  Impression & Plan: Assessment: 1.  NUCLEAR SCLEROSIS AGE RELATED; Right Eye (H25.11) 2.   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  BLINDNESS LEFT EYE CATEGORY 5, NORMAL VISION RIGHT EYE (H54.42A5) 4.  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  Cataract accounts for the patient's decreased vision. This visual impairment is not correctable with a tolerable change in glasses or contact lenses. Cataract surgery with an implantation of a new lens should significantly improve the visual and functional status of the patient. Discussed all risks, benefits, alternatives, and potential complications. Discussed the procedures and recovery. Patient desires to have surgery. A-scan ordered and performed today for intra-ocular lens calculations. The surgery will be performed in order to improve vision for driving, reading, and for eye examinations. Recommend phacoemulsification with intra-ocular lens. Right Eye. Dilates well - shugarcaine by protocol. 2.  Under the care of Dr. Ashley Royalty in [place]. Appreciate his care of this very pleasant patient. 3.  Monocular precautions discussed, including wearing shatterproof lenses. 4.  with significant cataract. 5.  blind eye. Non-painful. Monitor. Call with any worsening vision, pain, or any other concerns.

## 2019-08-22 ENCOUNTER — Other Ambulatory Visit: Payer: Self-pay

## 2019-08-22 ENCOUNTER — Encounter (INDEPENDENT_AMBULATORY_CARE_PROVIDER_SITE_OTHER): Payer: Medicare Other | Admitting: Ophthalmology

## 2019-08-22 DIAGNOSIS — H43813 Vitreous degeneration, bilateral: Secondary | ICD-10-CM | POA: Diagnosis not present

## 2019-08-22 DIAGNOSIS — H35033 Hypertensive retinopathy, bilateral: Secondary | ICD-10-CM

## 2019-08-22 DIAGNOSIS — H353211 Exudative age-related macular degeneration, right eye, with active choroidal neovascularization: Secondary | ICD-10-CM | POA: Diagnosis not present

## 2019-08-22 DIAGNOSIS — H2513 Age-related nuclear cataract, bilateral: Secondary | ICD-10-CM

## 2019-08-22 DIAGNOSIS — I10 Essential (primary) hypertension: Secondary | ICD-10-CM | POA: Diagnosis not present

## 2019-08-23 NOTE — Patient Instructions (Signed)
Alisha Shaw Threat  08/23/2019     @PREFPERIOPPHARMACY @   Your procedure is scheduled on  08/27/2019   Report to [facility] at  0700  A.M.  Call this number if you have problems the morning of surgery:  [phone]   Remember:  Do not eat or drink after midnight.                        Take these medicines the morning of surgery with A SIP OF WATER  Amlodipine, butalbital, pepcid, prozac, metoprolol. Use your inhalers before you come. Bring your rescue inhaler with you.    Do not wear jewelry, make-up or nail polish.  Do not wear lotions, powders, or perfumes, or deodorant.  Do not shave 48 hours prior to surgery.  Men may shave face and neck.  Do not bring valuables to the hospital.  [facility] is not responsible for any belongings or valuables.  Contacts, dentures or bridgework may not be worn into surgery.  Leave your suitcase in the car.  After surgery it may be brought to your room.  For patients admitted to the hospital, discharge time will be determined by your treatment team.  Patients discharged the [place] of surgery will not be allowed to drive home.   Name and phone number of your driver:   family Special instructions:  DO NOT smoke the morning of your procedure.  Please read over the following fact sheets that you were given. Anesthesia Post-op Instructions and Care and Recovery After Surgery      Cataract Surgery, Care After This sheet gives you information about how to care for yourself after your procedure. Your health care provider may also give you more specific instructions. If you have problems or questions, contact your health care provider. What can I expect after the procedure? After the procedure, it is common to have:  Itching.  Discomfort.  Fluid discharge.  Sensitivity to light and to touch.  Bruising in or around the eye.  Mild blurred vision. Follow these instructions at home: Eye care   Do not touch or rub your eyes.  Protect your  eyes as told by your health care provider. You may be told to wear a protective eye shield or sunglasses.  Do not put a contact lens into the affected eye or eyes until your health care provider approves.  Keep the area around your eye clean and dry: ? Avoid swimming. ? Do not allow water to hit you directly in the face while showering. ? Keep soap and shampoo out of your eyes.  Check your eye every [place] for signs of infection. Watch for: ? Redness, swelling, or pain. ? Fluid, blood, or pus. ? Warmth. ? A bad smell. ? Vision that is getting worse. ? Sensitivity that is getting worse. Activity  Do not drive for 24 hours if you were given a sedative during your procedure.  Avoid strenuous activities, such as playing contact sports, for as long as told by your health care provider.  Do not drive or use heavy machinery until your health care provider approves.  Do not bend or lift heavy objects. Bending increases pressure in the eye. You can walk, climb stairs, and do light household chores.  Ask your health care provider when you can return to work. If you work in a dusty environment, you may be advised to wear protective eyewear for a period of time. General instructions  Take  or apply over-the-counter and prescription medicines only as told by your health care provider. This includes eye drops.  Keep all follow-up visits as told by your health care provider. This is important. Contact a health care provider if:  You have increased bruising around your eye.  You have pain that is not helped with medicine.  You have a fever.  You have redness, swelling, or pain in your eye.  You have fluid, blood, or pus coming from your incision.  Your vision gets worse.  Your sensitivity to light gets worse. Get help right away if:  You have sudden loss of vision.  You see flashes of light or spots (floaters).  You have severe eye pain.  You develop nausea or  vomiting. Summary  After your procedure, it is common to have itching, discomfort, bruising, fluid discharge, or sensitivity to light.  Follow instructions from your health care provider about caring for your eye after the procedure.  Do not rub your eye after the procedure. You may need to wear eye protection or sunglasses. Do not wear contact lenses. Keep the area around your eye clean and dry.  Avoid activities that require a lot of effort. These include playing sports and lifting heavy objects.  Contact a health care provider if you have increased bruising, pain that does not go away, or a fever. Get help right away if you suddenly lose your vision, see flashes of light or spots, or have severe pain in the eye. This information is not intended to replace advice given to you by your health care provider. Make sure you discuss any questions you have with your health care provider. Document Revised: 10/17/2018 Document Reviewed: 06/20/2017 Elsevier Patient Education  2020 [employer].

## 2019-08-24 ENCOUNTER — Encounter (HOSPITAL_COMMUNITY)
Admission: RE | Admit: 2019-08-24 | Discharge: 2019-08-24 | Disposition: A | Discharge status: Home / Self Care | Payer: Medicare Other | Source: Ambulatory Visit | Attending: Ophthalmology | Admitting: Ophthalmology

## 2019-08-24 ENCOUNTER — Encounter (HOSPITAL_COMMUNITY): Payer: Self-pay

## 2019-08-24 ENCOUNTER — Other Ambulatory Visit: Payer: Self-pay

## 2019-08-24 ENCOUNTER — Other Ambulatory Visit (HOSPITAL_COMMUNITY)
Admission: RE | Admit: 2019-08-24 | Discharge: 2019-08-24 | Disposition: A | Discharge status: Home / Self Care | Payer: Medicare Other | Source: Ambulatory Visit | Attending: Ophthalmology | Admitting: Ophthalmology

## 2019-08-24 DIAGNOSIS — Z20822 Contact with and (suspected) exposure to covid-19: Secondary | ICD-10-CM | POA: Diagnosis not present

## 2019-08-24 DIAGNOSIS — Z01812 Encounter for preprocedural laboratory examination: Secondary | ICD-10-CM | POA: Diagnosis present

## 2019-08-24 HISTORY — DX: Chronic obstructive pulmonary disease, unspecified: J44.9

## 2019-08-24 HISTORY — DX: Palpitations: R00.2

## 2019-08-24 LAB — BASIC METABOLIC PANEL
Anion gap: 11 (ref 5–15)
BUN: 19 mg/dL (ref 8–23)
CO2: 25 mmol/L (ref 22–32)
Calcium: 8.9 mg/dL (ref 8.9–10.3)
Chloride: 101 mmol/L (ref 98–111)
Creatinine, Ser: 0.6 mg/dL (ref 0.44–1.00)
GFR calc Af Amer: >60 mL/min (ref >60)
GFR calc non Af Amer: >60 mL/min (ref >60)
Glucose, Bld: 100 mg/dL — ABNORMAL HIGH (ref 70–99)
Potassium: 3.7 mmol/L (ref 3.5–5.1)
Sodium: 137 mmol/L (ref 135–145)

## 2019-08-25 LAB — SARS CORONAVIRUS 2 (TAT 6-24 HRS): SARS Coronavirus 2: NEGATIVE

## 2019-08-27 ENCOUNTER — Ambulatory Visit (HOSPITAL_COMMUNITY)
Admission: RE | Admit: 2019-08-27 | Discharge: 2019-08-27 | Disposition: A | Discharge status: Home / Self Care | Payer: Medicare Other | Attending: Ophthalmology | Admitting: Ophthalmology

## 2019-08-27 ENCOUNTER — Other Ambulatory Visit: Payer: Self-pay

## 2019-08-27 ENCOUNTER — Encounter (HOSPITAL_COMMUNITY): Payer: Self-pay | Admitting: Ophthalmology

## 2019-08-27 ENCOUNTER — Ambulatory Visit (HOSPITAL_COMMUNITY): Payer: Medicare Other | Admitting: Anesthesiology

## 2019-08-27 ENCOUNTER — Encounter (HOSPITAL_COMMUNITY)
Admission: RE | Disposition: A | Discharge status: Home / Self Care | Payer: Self-pay | Source: Home / Self Care | Attending: Ophthalmology

## 2019-08-27 DIAGNOSIS — Z79899 Other long term (current) drug therapy: Secondary | ICD-10-CM | POA: Diagnosis not present

## 2019-08-27 DIAGNOSIS — H21542 Posterior synechiae (iris), left eye: Secondary | ICD-10-CM | POA: Diagnosis not present

## 2019-08-27 DIAGNOSIS — H5462 Unqualified visual loss, left eye, normal vision right eye: Secondary | ICD-10-CM | POA: Diagnosis not present

## 2019-08-27 DIAGNOSIS — K219 Gastro-esophageal reflux disease without esophagitis: Secondary | ICD-10-CM | POA: Diagnosis not present

## 2019-08-27 DIAGNOSIS — I10 Essential (primary) hypertension: Secondary | ICD-10-CM | POA: Insufficient documentation

## 2019-08-27 DIAGNOSIS — F419 Anxiety disorder, unspecified: Secondary | ICD-10-CM | POA: Insufficient documentation

## 2019-08-27 DIAGNOSIS — H3530 Unspecified macular degeneration: Secondary | ICD-10-CM | POA: Diagnosis not present

## 2019-08-27 DIAGNOSIS — H40052 Ocular hypertension, left eye: Secondary | ICD-10-CM | POA: Insufficient documentation

## 2019-08-27 DIAGNOSIS — F17210 Nicotine dependence, cigarettes, uncomplicated: Secondary | ICD-10-CM | POA: Diagnosis not present

## 2019-08-27 DIAGNOSIS — H2511 Age-related nuclear cataract, right eye: Secondary | ICD-10-CM | POA: Insufficient documentation

## 2019-08-27 DIAGNOSIS — J449 Chronic obstructive pulmonary disease, unspecified: Secondary | ICD-10-CM | POA: Insufficient documentation

## 2019-08-27 SURGERY — PHACOEMULSIFICATION, CATARACT, WITH IOL INSERTION
Anesthesia: Monitor Anesthesia Care | Site: Eye | Laterality: Right

## 2019-08-27 MED ORDER — POVIDONE-IODINE 5 % OP SOLN
OPHTHALMIC | Status: DC | PRN
  Administered 2019-08-27: 1 via OPHTHALMIC

## 2019-08-27 MED ORDER — CYCLOPENTOLATE-PHENYLEPHRINE 0.2-1 % OP SOLN
1.0000 [drp] | OPHTHALMIC | Status: AC | PRN
  Administered 2019-08-27 (×3): 1 [drp] via OPHTHALMIC

## 2019-08-27 MED ORDER — BSS IO SOLN
INTRAOCULAR | Status: DC | PRN
  Administered 2019-08-27: 15 mL via INTRAOCULAR

## 2019-08-27 MED ORDER — EPINEPHRINE PF 1 MG/ML IJ SOLN
INTRAOCULAR | Status: DC | PRN
  Administered 2019-08-27: 500 mL

## 2019-08-27 MED ORDER — TETRACAINE HCL 0.5 % OP SOLN
1.0000 [drp] | OPHTHALMIC | Status: AC | PRN
  Administered 2019-08-27 (×3): 1 [drp] via OPHTHALMIC

## 2019-08-27 MED ORDER — SODIUM HYALURONATE 23 MG/ML IO SOLN
INTRAOCULAR | Status: DC | PRN
  Administered 2019-08-27: 0.6 mL via INTRAOCULAR

## 2019-08-27 MED ORDER — PROVISC 10 MG/ML IO SOLN
INTRAOCULAR | Status: DC | PRN
  Administered 2019-08-27: 0.85 mL via INTRAOCULAR

## 2019-08-27 MED ORDER — LIDOCAINE HCL 3.5 % OP GEL
1.0000 "application " | Freq: Once | OPHTHALMIC | Status: AC
  Administered 2019-08-27: 1 via OPHTHALMIC

## 2019-08-27 MED ORDER — NEOMYCIN-POLYMYXIN-DEXAMETH 3.5-10000-0.1 OP SUSP
OPHTHALMIC | Status: DC | PRN
  Administered 2019-08-27: 1 [drp] via OPHTHALMIC

## 2019-08-27 MED ORDER — PHENYLEPHRINE HCL 2.5 % OP SOLN
1.0000 [drp] | OPHTHALMIC | Status: AC | PRN
  Administered 2019-08-27 (×3): 1 [drp] via OPHTHALMIC

## 2019-08-27 MED ORDER — LIDOCAINE HCL (PF) 1 % IJ SOLN
INTRAOCULAR | Status: DC | PRN
  Administered 2019-08-27: 1 mL via OPHTHALMIC

## 2019-08-27 SURGICAL SUPPLY — 12 items
CLOTH BEACON ORANGE TIMEOUT ST (SAFETY) ×2 IMPLANT
EYE SHIELD UNIVERSAL CLEAR (GAUZE/BANDAGES/DRESSINGS) ×2 IMPLANT
GLOVE BIOGEL PI IND STRL 7.0 (GLOVE) ×2 IMPLANT
GLOVE BIOGEL PI INDICATOR 7.0 (GLOVE) ×2
LENS ALC ACRYL/TECN (Ophthalmic Related) ×2 IMPLANT
NEEDLE HYPO 18GX1.5 BLUNT FILL (NEEDLE) ×2 IMPLANT
PAD ARMBOARD 7.5X6 YLW CONV (MISCELLANEOUS) ×2 IMPLANT
SYR TB 1ML LL NO SAFETY (SYRINGE) ×2 IMPLANT
TAPE SURG TRANSPORE 1 IN (GAUZE/BANDAGES/DRESSINGS) ×1 IMPLANT
TAPE SURGICAL TRANSPORE 1 IN (GAUZE/BANDAGES/DRESSINGS) ×2
VISCOELASTIC ADDITIONAL (OPHTHALMIC RELATED) ×2 IMPLANT
WATER STERILE IRR 250ML POUR (IV SOLUTION) ×2 IMPLANT

## 2019-08-27 NOTE — Discharge Instructions (Signed)
Please discharge patient when stable, will follow up today with Dr. Kushi Kun at the [facility] office immediately following discharge.  Leave shield in place until visit.  All paperwork with discharge instructions will be given at the office.  [facility] Address:  [address]  [place], [place] [address]  

## 2019-08-27 NOTE — Anesthesia Preprocedure Evaluation (Signed)
Anesthesia Evaluation  Patient identified by MRN, date of birth, ID band Patient awake    Reviewed: Allergy & Precautions, H&P , NPO status , Patient's Chart, lab work & pertinent test results, reviewed documented beta blocker date and time   Airway Mallampati: II  TM Distance: >3 FB Neck ROM: full    Dental no notable dental hx.    Pulmonary COPD, Current Smoker and Patient abstained from smoking.,    Pulmonary exam normal breath sounds clear to auscultation       Cardiovascular Exercise Tolerance: Good hypertension, negative cardio ROS   Rhythm:regular Rate:Normal     Neuro/Psych  Headaches, PSYCHIATRIC DISORDERS Anxiety Depression    GI/Hepatic Neg liver ROS, GERD  Medicated,  Endo/Other  negative endocrine ROS  Renal/GU negative Renal ROS  negative genitourinary   Musculoskeletal   Abdominal   Peds  Hematology negative hematology ROS (+)   Anesthesia Other Findings   Reproductive/Obstetrics negative OB ROS                             Anesthesia Physical Anesthesia Plan  ASA: II  Anesthesia Plan: MAC   Post-op Pain Management:    Induction:   PONV Risk Score and Plan:   Airway Management Planned:   Additional Equipment:   Intra-op Plan:   Post-operative Plan:   Informed Consent: I have reviewed the patients History and Physical, chart, labs and discussed the procedure including the risks, benefits and alternatives for the proposed anesthesia with the patient or authorized representative who has indicated his/her understanding and acceptance.     Dental Advisory Given  Plan Discussed with: CRNA  Anesthesia Plan Comments:         Anesthesia Quick Evaluation

## 2019-08-27 NOTE — Transfer of Care (Signed)
Immediate Anesthesia Transfer of Care Note  Patient: Alisha Shaw  Procedure(s) Performed: CATARACT EXTRACTION PHACO AND INTRAOCULAR LENS PLACEMENT (IOC) (Right Eye)  Patient Location: Short Stay  Anesthesia Type:MAC  Level of Consciousness: awake  Airway & Oxygen Therapy: Patient Spontanous Breathing  Post-op Assessment: Report given to RN  Post vital signs: Reviewed and stable  Last Vitals:  Vitals Value Taken Time  BP    Temp    Pulse    Resp    SpO2      Last Pain:  Vitals:   08/27/19 0709  TempSrc: Oral  PainSc: 0-No pain         Complications: No complications documented.

## 2019-08-27 NOTE — Op Note (Signed)
Date of procedure: 08/27/19  Pre-operative diagnosis: Visually significant age-related nuclear cataract, Right Eye (H25.11)  Post-operative diagnosis: Visually significant age-related nuclear cataract, Right Eye  Procedure: Removal of cataract via phacoemulsification and insertion of intra-ocular lens Wynetta Emery and [employer] DCB00  +28.5D into the capsular bag of the Right Eye  Attending surgeon: Gerda Diss. Justine Cossin, MD, MA  Anesthesia: MAC, Topical Akten  Complications: None  Estimated Blood Loss: <61m (minimal)  Specimens: None  Implants: As above  Indications:  Visually significant age-related cataract, Right Eye  Procedure:  The patient was seen and identified in the pre-operative area. The operative eye was identified and dilated.  The operative eye was marked.  Topical anesthesia was administered to the operative eye.     The patient was then to the operative suite and placed in the supine position.  A timeout was performed confirming the patient, procedure to be performed, and all other relevant information.   The patient's face was prepped and draped in the usual fashion for intra-ocular surgery.  A lid speculum was placed into the operative eye and the surgical microscope moved into place and focused.  A superotemporal paracentesis was created using a 20 gauge paracentesis blade.  Shugarcaine was injected into the anterior chamber.  Viscoelastic was injected into the anterior chamber.  A temporal clear-corneal main wound incision was created using a 2.458mmicrokeratome.  A continuous curvilinear capsulorrhexis was initiated using an irrigating cystitome and completed using capsulorrhexis forceps.  Hydrodissection and hydrodeliniation were performed.  Viscoelastic was injected into the anterior chamber.  A phacoemulsification handpiece and a chopper as a second instrument were used to remove the nucleus and epinucleus. The irrigation/aspiration handpiece was used to remove any  remaining cortical material.   The capsular bag was reinflated with viscoelastic, checked, and found to be intact.  The intraocular lens was inserted into the capsular bag.  The irrigation/aspiration handpiece was used to remove any remaining viscoelastic.  The clear corneal wound and paracentesis wounds were then hydrated and checked with Weck-Cels to be watertight.  The lid-speculum and drape was removed, and the patient's face was cleaned with a wet and dry 4x4.  Maxitrol was instilled in the eye before a clear shield was taped over the eye. The patient was taken to the post-operative care unit in good condition, having tolerated the procedure well.  Post-Op Instructions: [facility] [facility] and will receive all other orders and instructions.

## 2019-08-27 NOTE — Anesthesia Postprocedure Evaluation (Signed)
Anesthesia Post Note  Patient: Alisha Shaw  Procedure(s) Performed: CATARACT EXTRACTION PHACO AND INTRAOCULAR LENS PLACEMENT (IOC) (Right Eye)  Patient location during evaluation: Short Stay Anesthesia Type: MAC Level of consciousness: awake and alert and oriented Pain management: pain level controlled Vital Signs Assessment: post-procedure vital signs reviewed and stable Respiratory status: spontaneous breathing Cardiovascular status: blood pressure returned to baseline and stable Postop Assessment: no apparent nausea or vomiting Anesthetic complications: no   No complications documented.   Last Vitals:  Vitals:   08/27/19 0709  BP: (!) 116/53  Pulse: 62  Resp: (!) 21  Temp: 36.7 C  SpO2: 95%    Last Pain:  Vitals:   08/27/19 0709  TempSrc: Oral  PainSc: 0-No pain                 Zuma Hust

## 2019-08-27 NOTE — Interval H&P Note (Signed)
History and Physical Interval Note:  08/27/2019 8:20 AM  Alisha Shaw  has presented today for surgery, with the diagnosis of Nuclear sclerotic cataract - Right eye.  The various methods of treatment have been discussed with the patient and family. After consideration of risks, benefits and other options for treatment, the patient has consented to  Procedure(s) with comments: CATARACT EXTRACTION PHACO AND INTRAOCULAR LENS PLACEMENT (IOC) (Right) - right as a surgical intervention.  The patient's history has been reviewed, patient examined, no change in status, stable for surgery.  I have reviewed the patient's chart and labs.  Questions were answered to the patient's satisfaction.     [name]

## 2019-08-28 ENCOUNTER — Encounter (HOSPITAL_COMMUNITY): Payer: Self-pay | Admitting: Ophthalmology

## 2019-09-11 ENCOUNTER — Telehealth: Payer: Self-pay | Admitting: Cardiovascular Disease

## 2019-09-11 MED ORDER — METOPROLOL TARTRATE 25 MG PO TABS: 25.0000 mg | ORAL_TABLET | Freq: Two times a day (BID) | ORAL | 3 refills | Status: DC

## 2019-09-11 NOTE — Telephone Encounter (Signed)
Noted.  I reviewed the chart, Alisha Shaw was previously followed by Dr. Purvis Sheffield.  It looks like Lopressor 12.5 mg was to be used twice daily (not three times a Ramer).  If she is experiencing more palpitations, I would suggest increasing Lopressor to 25 mg twice daily for now.  If she does not already have a follow-up visit, please schedule her to see [place] for further review.

## 2019-09-11 NOTE — Telephone Encounter (Signed)
Pt called and stated that she is taking 12.5 mg of Lopressor three times daily.states that she takes it at 9 am, 1 pm and at 9 pm daily. She complains that the palpitations have gotten worse in the last 1 1/2 weeks. She does have SOB during the episodes and feels weak after. Please advise.

## 2019-09-11 NOTE — Telephone Encounter (Signed)
Pt notified and order placed 

## 2019-09-11 NOTE — Telephone Encounter (Signed)
New message     Patient called she needs to have clarification on her medication , she thinks she might be taking them wrong?

## 2019-09-20 ENCOUNTER — Encounter: Payer: Self-pay | Admitting: Student

## 2019-09-20 ENCOUNTER — Other Ambulatory Visit: Payer: Self-pay

## 2019-09-20 ENCOUNTER — Encounter: Payer: Self-pay | Admitting: *Deleted

## 2019-09-20 ENCOUNTER — Ambulatory Visit: Payer: Medicare Other | Admitting: Student

## 2019-09-20 VITALS — BP 144/80 | HR 70 | Wt 177.8 lb

## 2019-09-20 DIAGNOSIS — E782 Mixed hyperlipidemia: Secondary | ICD-10-CM | POA: Diagnosis not present

## 2019-09-20 DIAGNOSIS — Z23 Encounter for immunization: Secondary | ICD-10-CM | POA: Diagnosis not present

## 2019-09-20 DIAGNOSIS — R0600 Dyspnea, unspecified: Secondary | ICD-10-CM | POA: Diagnosis not present

## 2019-09-20 DIAGNOSIS — R0609 Other forms of dyspnea: Secondary | ICD-10-CM

## 2019-09-20 DIAGNOSIS — I10 Essential (primary) hypertension: Secondary | ICD-10-CM

## 2019-09-20 DIAGNOSIS — R002 Palpitations: Secondary | ICD-10-CM | POA: Diagnosis not present

## 2019-09-20 NOTE — Progress Notes (Signed)
Cardiology Office Note    Date:  09/20/2019   ID:  Alisha Shaw, DOB 1950-05-14, MRN [id number]  PCP:  Leanna Sato, MD  Cardiologist: Nona Dell, MD    Chief Complaint  Patient presents with  . Follow-up    palpitations    History of Present Illness:    Alisha Shaw is a 69 y.o. female with past medical history of HTN, HLD, COPD, tobacco use and palpitations who presents to the office today for follow-up in regards to worsening palpitations.  She was examined by Dr. Purvis Sheffield in 04/2019 as a new patient referral for episodic palpitations and a prior syncopal episode. A 30-Pitz event monitor was recommended for further evaluation along with an echocardiogram and Lexiscan Myoview given her dyspnea on exertion. Her echocardiogram showed a preserved EF of 65 to 70% with no regional wall motion abnormalities. She did have grade 1 diastolic dysfunction and no significant valve abnormalities. NST showed no evidence of ischemia and was overall a low-risk study. Her event monitor showed sinus rhythm and sinus tachycardia with frequent PAC's and brief atrial runs but no sustained arrhythmias. No changes were made to her cardiac regimen at that time.  She did call the office in 08/2019 reporting worsening palpitations and was started on Lopressor 12.5 mg twice daily as needed. She called earlier this month reporting worsening symptoms again, therefore Lopressor was titrated to 25 mg twice daily and follow-up was arranged.  In talking with the patient today, she reports having palpitations which were occurring a few days each week and can last for seconds to minutes at a time. Reports associated dyspnea but denies any associated dizziness or presyncope. Symptoms have improved since Lopressor was titrated to 25 mg twice daily. No recent chest pain, orthopnea, PND or lower extremity edema. She does have baseline dyspnea on exertion in the setting of COPD and continues to smoke approximately 3  packs/[place].  Past Medical History:  Diagnosis Date  . Anxiety   . COPD (chronic obstructive pulmonary disease) (HCC)   . Depression   . GERD (gastroesophageal reflux disease)    indigestion at times not related to food  . Headache(784.0)    history of migranes  . Hypertension   . Palpitations with regular cardiac rhythm     Past Surgical History:  Procedure Laterality Date  . ABDOMINAL HYSTERECTOMY  1995  . APPENDECTOMY     removed when had gallbladder surgery  . CATARACT EXTRACTION W/PHACO Right 08/27/2019   Procedure: CATARACT EXTRACTION PHACO AND INTRAOCULAR LENS PLACEMENT (IOC);  Surgeon: [name], MD;  Location: [facility];  Service: Ophthalmology;  Laterality: Right;  CDE: 6.90  . CHOLECYSTECTOMY  1976   [place]  . EYE SURGERY Left 2012   [place].  Marland Kitchen HEMORRHOID SURGERY N/A 07/17/2012   Procedure: HEMORRHOIDECTOMY;  Surgeon: Dalia Heading, MD;  Location: [facility];  Service: General;  Laterality: N/A;  . spider bite Left 2011   brown recluse spider bite    Current Medications: Outpatient Medications Prior to Visit  Medication Sig Dispense Refill  . albuterol (VENTOLIN HFA) 108 (90 Base) MCG/ACT inhaler Inhale 2 puffs into the lungs every 6 (six) hours as needed for wheezing or shortness of breath.    Marland Kitchen amLODipine (NORVASC) 5 MG tablet Take 5 mg by mouth daily.    Marland Kitchen atorvastatin (LIPITOR) 40 MG tablet Take 40 mg by mouth daily.    Marland Kitchen atropine 1 % ophthalmic solution Place 1 drop into the left  eye once a week.    . butalbital-aspirin-caffeine (BUTALBITAL COMPOUND/ASA) 50-325-40 MG capsule Take 1 capsule by mouth 2 (two) times daily.     . cholecalciferol (VITAMIN D3) 25 MCG (1000 UNIT) tablet Take 1,000 Units by mouth daily.    . famotidine (PEPCID) 20 MG tablet Take 20 mg by mouth daily.     Marland Kitchen. FLUoxetine (PROZAC) 40 MG capsule Take 40 mg by mouth daily.    . fluticasone (FLONASE) 50 MCG/ACT nasal spray Place 2 sprays into both nostrils daily as needed for allergies or  rhinitis.    . hydrochlorothiazide (HYDRODIURIL) 25 MG tablet Take 25 mg by mouth daily.    Marland Kitchen. losartan (COZAAR) 100 MG tablet Take 100 mg by mouth daily.     . metoprolol tartrate (LOPRESSOR) 25 MG tablet Take 1 tablet (25 mg total) by mouth 2 (two) times daily. 180 tablet 3  . Multiple Vitamins-Minerals (PRESERVISION AREDS 2 PO) Take 1 capsule by mouth 2 (two) times daily.      No facility-administered medications prior to visit.     Allergies:   Ciprofloxacin   Social History   Socioeconomic History  . Marital status: Married    Spouse name: Not on file  . Number of children: Not on file  . Years of education: Not on file  . Highest education level: Not on file  Occupational History  . Not on file  Tobacco Use  . Smoking status: Current Every [place] Smoker    Packs/[place]ia: 3.00    Years: 50.00    Pack years: 150.00    Types: Cigarettes  . Smokeless tobacco: Never Used  Vaping Use  . Vaping Use: Never used  Substance and Sexual Activity  . Alcohol use: Not Currently    Comment: socially  . Drug use: No  . Sexual activity: Never  Other Topics Concern  . Not on file  Social History Narrative  . Not on file   Social Determinants of Health   Financial Resource Strain:   . Difficulty of Paying Living Expenses: Not on file  Food Insecurity:   . Worried About Programme researcher, broadcasting/film/videounning Out of Food in the Last Year: Not on file  . Ran Out of Food in the Last Year: Not on file  Transportation Needs:   . Lack of Transportation (Medical): Not on file  . Lack of Transportation (Non-Medical): Not on file  Physical Activity:   . Days of Exercise per Week: Not on file  . Minutes of Exercise per Session: Not on file  Stress:   . Feeling of Stress : Not on file  Social Connections:   . Frequency of Communication with Friends and Family: Not on file  . Frequency of Social Gatherings with Friends and Family: Not on file  . Attends Religious Services: Not on file  . Active Member of Clubs or  Organizations: Not on file  . Attends BankerClub or Organization Meetings: Not on file  . Marital Status: Not on file     Family History:  The patient's family history includes CAD in her brother; CVA in her brother; Parkinson's disease in her father and sister.   Review of Systems:   Please see the history of present illness.     General:  No chills, fever, night sweats or weight changes.  Cardiovascular:  No chest pain, edema, orthopnea, paroxysmal nocturnal dyspnea. Positive for palpitations and dyspnea on exertion.  Dermatological: No rash, lesions/masses Respiratory: No cough, dyspnea Urologic: No hematuria, dysuria Abdominal:  No nausea, vomiting, diarrhea, bright red blood per rectum, melena, or hematemesis Neurologic:  No visual changes, wkns, changes in mental status. All other systems reviewed and are otherwise negative except as noted above.   Physical Exam:    VS:  BP (!) 144/80   Pulse 70   Wt 177 lb 12.8 oz (80.6 kg)   SpO2 97%   BMI 30.52 kg/m    General: Well developed, well nourished,female appearing in no acute distress. Head: Normocephalic, atraumatic. Neck: No carotid bruits. JVD not elevated.  Lungs: Respirations regular and unlabored, without wheezes or rales.  Heart: Regular rate and rhythm with occasional ectopic beats. No S3 or S4.  No murmur, no rubs, or gallops appreciated. Abdomen: Appears non-distended. No obvious abdominal masses. Msk:  Strength and tone appear normal for age. No obvious joint deformities or effusions. Extremities: No clubbing or cyanosis. No lower extremity edema.  Distal pedal pulses are 2+ bilaterally. Neuro: Alert and oriented X 3. Moves all extremities spontaneously. No focal deficits noted. Psych:  Responds to questions appropriately with a normal affect. Skin: No rashes or lesions noted  Wt Readings from Last 3 Encounters:  09/20/19 177 lb 12.8 oz (80.6 kg)  08/24/19 174 lb (78.9 kg)  04/16/19 180 lb (81.6 kg)      Studies/Labs Reviewed:   EKG:  EKG is not ordered today.   Recent Labs: 08/24/2019: BUN 19; Creatinine, Ser 0.60; Potassium 3.7; Sodium 137   Lipid Panel No results found for: CHOL, TRIG, HDL, CHOLHDL, VLDL, LDLCALC, LDLDIRECT  Additional studies/ records that were reviewed today include:   Event Monitor: 04/2019  Sinus rhythm, sinus arrhythmia, sinus tachycardia, and frequent PACs. Symptoms primarily correlated with PACs. There were brief atrial runs. There were no sustained arrhythmias.  Echocardiogram: 04/2019 IMPRESSIONS    1. Left ventricular ejection fraction, by estimation, is 65 to 70%. The  left ventricle has normal function. The left ventricle has no regional  wall motion abnormalities. Left ventricular diastolic parameters are  consistent with Grade I diastolic  dysfunction (impaired relaxation).  2. Right ventricular systolic function is normal. The right ventricular  size is normal.  3. The mitral valve is normal in structure. No evidence of mitral valve  regurgitation. No evidence of mitral stenosis.  4. The aortic valve is tricuspid. Aortic valve regurgitation is not  visualized. No aortic stenosis is present.  5. The inferior vena cava is normal in size with greater than 50%  respiratory variability, suggesting right atrial pressure of 3 mmHg.    NST: 04/2019  There was no ST segment deviation noted during stress.  The study is normal. There are no perfusion defects.  This is a low risk study.  The left ventricular ejection fraction is normal (55-65%).    Assessment:    1. Palpitations   2. DOE (dyspnea on exertion)   3. Essential hypertension   4. Mixed hyperlipidemia   5. Need for immunization against influenza      Plan:   In order of problems listed above:  1. Palpitations - Prior monitor earlier this year showed sinus rhythm and sinus tachycardia with frequent PAC's and brief atrial runs but no sustained arrhythmias. - She  reports her palpitations improved after being started on Lopressor but she would experience symptoms prior to taking her next dose, therefore this was titrated to 25 mg twice daily. She feels like her symptoms have been well controlled since. We reviewed that this can be switched to Toprol-XL in the future  for sustained release if she continues to have frequent episodes. She is aware to take an extra 12.5 mg of Lopressor as needed for palpitations. Reviewed the importance of reducing her caffeine intake. She did have recent labs by her PCP last month and I will request a copy of these records.  2. Dyspnea on Exertion - Echocardiogram earlier this year showed a preserved EF with no regional wall motion abnormalities as outlined above and her stress test showed no evidence of ischemia, overall being a low risk study.   - Symptoms have been stable since her last visit and are felt to be secondary to COPD. Given her recent reassuring cardiac testing, would not pursue further cardiac evaluation at this time. Smoking cessation advised as she currently smokes 3 ppd.   3. HTN - BP is elevated at 144/80 during today's visit but has otherwise been well controlled at other visits. Continue Amlodipine 5mg  daily, HCTZ 25mg  daily, Losartan 100mg  daily and Lopressor 25 mg twice daily for now. Amlodipine could be further titrated in the future if additional BP control is needed.  4. HLD - Followed by her PCP. She remains on Atorvastatin 40 mg daily.  Will request a copy of recent records as outlined above.  5. Need for Influenza Vaccination - Flu shot was administered by nursing staff during today's visit.     Medication Adjustments/Labs and Tests Ordered: Current medicines are reviewed at length with the patient today.  Concerns regarding medicines are outlined above.  Medication changes, Labs and Tests ordered today are listed in the Patient Instructions below. Patient Instructions  Medication Instructions:    Your physician recommends that you continue on your current medications as directed. Please refer to the Current Medication list given to you today.  *If you need a refill on your cardiac medications before your next appointment, please call your pharmacy*   Lab Work: NONE  If you have labs (blood work) drawn today and your tests are completely normal, you will receive your results only by: MyChart Message (if you have MyChart) OR . A paper copy in the mail If you have any lab test that is abnormal or we need to change your treatment, we will call you to review the results.   Testing/Procedures: NONE  Follow-Up: At [facility], you and your health needs are our priority.  As part of our continuing mission to provide you with exceptional heart care, we have created designated Provider Care Teams.  These Care Teams include your primary Cardiologist (physician) and Advanced Practice Providers (APPs -  Physician Assistants and Nurse Practitioners) who all work together to provide you with the care you need, when you need it.  We recommend signing up for the patient portal called "MyChart".  Sign up information is provided on this After Visit Summary.  MyChart is used to connect with patients for Virtual Visits (Telemedicine).  Patients are able to view lab/test results, encounter notes, upcoming appointments, etc.  Non-urgent messages can be sent to your provider as well.   To learn more about what you can do with MyChart, go to .    Your next appointment:   6 month(s)  The format for your next appointment:   In Person  Provider:   Marland Kitchen, MD or [facility], PA-C   Other Instructions Thank you for choosing [facility]!       Signed, [email or web address], PA-C  09/20/2019 5:06 PM    [facility]  [address] [place], [place] [address] Phone: [phone] Fax: [phone]

## 2019-09-20 NOTE — Patient Instructions (Signed)
Medication Instructions:  Your physician recommends that you continue on your current medications as directed. Please refer to the Current Medication list given to you today.  *If you need a refill on your cardiac medications before your next appointment, please call your pharmacy*   Lab Work: NONE   If you have labs (blood work) drawn today and your tests are completely normal, you will receive your results only by: . MyChart Message (if you have MyChart) OR . A paper copy in the mail If you have any lab test that is abnormal or we need to change your treatment, we will call you to review the results.   Testing/Procedures: NONE    Follow-Up: At [facility], you and your health needs are our priority.  As part of our continuing mission to provide you with exceptional heart care, we have created designated Provider Care Teams.  These Care Teams include your primary Cardiologist (physician) and Advanced Practice Providers (APPs -  Physician Assistants and Nurse Practitioners) who all work together to provide you with the care you need, when you need it.  We recommend signing up for the patient portal called "MyChart".  Sign up information is provided on this After Visit Summary.  MyChart is used to connect with patients for Virtual Visits (Telemedicine).  Patients are able to view lab/test results, encounter notes, upcoming appointments, etc.  Non-urgent messages can be sent to your provider as well.   To learn more about what you can do with MyChart, go to [email or web address].    Your next appointment:   6 month(s)  The format for your next appointment:   In Person  Provider:   Samuel McDowell, MD or Brittany Strader, PA-C   Other Instructions Thank you for choosing [facility]!    

## 2019-11-12 ENCOUNTER — Telehealth: Payer: Self-pay | Admitting: *Deleted

## 2019-11-12 NOTE — Telephone Encounter (Signed)
   Primary Cardiologist: Nona Dell, MD  Chart reviewed as part of pre-operative protocol coverage. Patient was contacted 11/12/2019 in reference to pre-operative risk assessment for pending surgery as outlined below.  Alisha Shaw was last seen on 09/10/19 by Randall An, PA-C.  Since that [place], Alisha Shaw has done fine from a cardiac standpoint. She has chronic DOE 2/2 COPD which is unchanged. While the hip pain limits her activity, she can still complete 4 METs without anginal complaints.  Therefore, based on ACC/AHA guidelines, the patient would be at acceptable risk for the planned procedure without further cardiovascular testing.   The patient was advised that if she develops new symptoms prior to surgery to contact our office to arrange for a follow-up visit, and she verbalized understanding.  I will route this recommendation to the requesting party via Epic fax function and remove from pre-op pool. Please call with questions.  Beatriz Stallion, PA-C 11/12/2019, 2:42 PM

## 2019-11-12 NOTE — Telephone Encounter (Signed)
   [facility] Pre-operative Risk Assessment    HEARTCARE STAFF: - Please ensure there is not already an duplicate clearance open for this procedure. - Under Visit Info/Reason for Call, type in Other and utilize the format Clearance MM/DD/YY or Clearance TBD. Do not use dashes or single digits. - If request is for dental extraction, please clarify the # of teeth to be extracted.  Request for surgical clearance:  1. What type of surgery is being performed? . RIGHT TOTAL HIP ARTHROPLASTY   2. When is this surgery scheduled?  12/03/19   3. What type of clearance is required (medical clearance vs. Pharmacy clearance to hold med vs. Both)? MEDICAL    4. Are there any medications that need to be held prior to surgery and how long? N/A   5. Practice name and name of physician performing surgery?  [facility] / DR. Harlow Mares   6. What is the office phone number?  [phone] EX: [phone]   1.   What is the office fax number?  [phone]  8.   Anesthesia type (None, local, MAC, general) ?  SPINAL / LOCAL   Jeanann Lewandowsky 11/12/2019, 1:40 PM  _________________________________________________________________   (provider comments below)

## 2019-11-16 ENCOUNTER — Ambulatory Visit: Payer: Medicare Other | Admitting: Student

## 2019-11-21 ENCOUNTER — Encounter: Payer: Self-pay | Admitting: Orthopedic Surgery

## 2019-11-21 ENCOUNTER — Other Ambulatory Visit: Payer: Self-pay | Admitting: Orthopedic Surgery

## 2019-11-21 NOTE — H&P (Signed)
NAME: Alisha Shaw MRN:   [id number] DOB:   1950/08/06     HISTORY AND PHYSICAL  CHIEF COMPLAINT:  Right hip pain  HISTORY:   Alisha Shaw a 69 y.o. female  with right  Hip Pain Patient complains of right hip pain. Onset of the symptoms was several years ago. Inciting event: known DJD. The patient reports the hip pain is worse with weight bearing. Associated symptoms: none. Aggravating symptoms include: any weight bearing. Patient has had no prior hip problems. Previous visits for this problem: yes, last seen several weeks ago by me. Evaluation to date: plain films, which were abnormal  osteoarthritis severe. Treatment to date: OTC analgesics, which have been somewhat effective, prescription analgesics, which have been somewhat effective, home exercise program, which has been somewhat effective and physical therapy, which has been somewhat effective.  Plan for right total hip replacement  PAST MEDICAL HISTORY:   Past Medical History:  Diagnosis Date  . Anxiety   . COPD (chronic obstructive pulmonary disease) (HCC)   . Depression   . GERD (gastroesophageal reflux disease)    indigestion at times not related to food  . Headache(784.0)    history of migranes  . Hypertension   . Palpitations with regular cardiac rhythm     PAST SURGICAL HISTORY:   Past Surgical History:  Procedure Laterality Date  . ABDOMINAL HYSTERECTOMY  1995  . APPENDECTOMY     removed when had gallbladder surgery  . CATARACT EXTRACTION W/PHACO Right 08/27/2019   Procedure: CATARACT EXTRACTION PHACO AND INTRAOCULAR LENS PLACEMENT (IOC);  Surgeon: [name], MD;  Location: [facility];  Service: Ophthalmology;  Laterality: Right;  CDE: 6.90  . CHOLECYSTECTOMY  1976   [place]  . EYE SURGERY Left 2012   [place] deg.  Marland Kitchen HEMORRHOID SURGERY N/A 07/17/2012   Procedure: HEMORRHOIDECTOMY;  Surgeon: Dalia Heading, MD;  Location: [facility];  Service: General;  Laterality: N/A;  . spider bite Left 2011   brown recluse spider  bite    MEDICATIONS:  (Not in a hospital admission)   ALLERGIES:   Allergies  Allergen Reactions  . Ciprofloxacin     Makes my mouth sore    REVIEW OF SYSTEMS:   Negative except HPI  FAMILY HISTORY:   Family History  Problem Relation Age of Onset  . Parkinson's disease Father   . Parkinson's disease Sister   . CVA Brother   . CAD Brother     SOCIAL HISTORY:   reports that she has been smoking cigarettes. She has a 150.00 pack-year smoking history. She has never used smokeless tobacco. She reports previous alcohol use. She reports that she does not use drugs.  PHYSICAL EXAM:  General appearance: alert, cooperative and no distress Neck: no JVD and supple, symmetrical, trachea midline Resp: clear to auscultation bilaterally Cardio: regular rate and rhythm, S1, S2 normal, no murmur, click, rub or gallop GI: soft, non-tender; bowel sounds normal; no masses,  no organomegaly Extremities: extremities normal, atraumatic, no cyanosis or edema and Homans sign is negative, no sign of DVT Pulses: 2+ and symmetric Skin: Skin color, texture, turgor normal. No rashes or lesions Neurologic: Alert and oriented X 3, normal strength and tone. Normal symmetric reflexes. Normal coordination and gait    LABORATORY STUDIES: No results for input(s): WBC, HGB, HCT, PLT in the last 72 hours.  No results for input(s): NA, K, CL, CO2, GLUCOSE, BUN, CREATININE, CALCIUM in the last 72 hours.  STUDIES/RESULTS:  No results found.  ASSESSMENT:  End stage osteoarthritis right hip        Active Problems:   * No active hospital problems. *    PLAN:  Right Primary Total Hip   Altamese Cabal 11/21/2019. 11:18 AM

## 2019-11-28 ENCOUNTER — Encounter
Admission: RE | Admit: 2019-11-28 | Discharge: 2019-11-28 | Disposition: A | Discharge status: Home / Self Care | Payer: Medicare Other | Source: Ambulatory Visit | Attending: Orthopedic Surgery | Admitting: Orthopedic Surgery

## 2019-11-28 ENCOUNTER — Other Ambulatory Visit: Payer: Self-pay

## 2019-11-28 DIAGNOSIS — Z01818 Encounter for other preprocedural examination: Secondary | ICD-10-CM | POA: Diagnosis present

## 2019-11-28 DIAGNOSIS — Z01810 Encounter for preprocedural cardiovascular examination: Secondary | ICD-10-CM

## 2019-11-28 HISTORY — DX: Other specified postprocedural states: Z98.890

## 2019-11-28 HISTORY — DX: Personal history of urinary calculi: Z87.442

## 2019-11-28 HISTORY — DX: Nausea with vomiting, unspecified: R11.2

## 2019-11-28 HISTORY — DX: Family history of other specified conditions: Z84.89

## 2019-11-28 LAB — URINALYSIS, ROUTINE W REFLEX MICROSCOPIC
Bilirubin Urine: NEGATIVE
Glucose, UA: NEGATIVE mg/dL
Hgb urine dipstick: NEGATIVE
Ketones, ur: NEGATIVE mg/dL
Leukocytes,Ua: NEGATIVE
Nitrite: NEGATIVE
Protein, ur: NEGATIVE mg/dL
Specific Gravity, Urine: 1.016 (ref 1.005–1.030)
pH: 6 (ref 5.0–8.0)

## 2019-11-28 LAB — BASIC METABOLIC PANEL
Anion gap: 12 (ref 5–15)
BUN: 18 mg/dL (ref 8–23)
CO2: 27 mmol/L (ref 22–32)
Calcium: 9.4 mg/dL (ref 8.9–10.3)
Chloride: 98 mmol/L (ref 98–111)
Creatinine, Ser: 0.64 mg/dL (ref 0.44–1.00)
GFR, Estimated: >60 mL/min (ref >60)
Glucose, Bld: 94 mg/dL (ref 70–99)
Potassium: 3.1 mmol/L — ABNORMAL LOW (ref 3.5–5.1)
Sodium: 137 mmol/L (ref 135–145)

## 2019-11-28 LAB — SURGICAL PCR SCREEN
MRSA, PCR: NEGATIVE
Staphylococcus aureus: NEGATIVE

## 2019-11-28 LAB — CBC
HCT: 36.6 % (ref 36.0–46.0)
Hemoglobin: 12.8 g/dL (ref 12.0–15.0)
MCH: 34 pg (ref 26.0–34.0)
MCHC: 35 g/dL (ref 30.0–36.0)
MCV: 97.3 fL (ref 80.0–100.0)
Platelets: 248 10*3/uL (ref 150–400)
RBC: 3.76 MIL/uL — ABNORMAL LOW (ref 3.87–5.11)
RDW: 14 % (ref 11.5–15.5)
WBC: 8 10*3/uL (ref 4.0–10.5)
nRBC: 0 % (ref 0.0–0.2)

## 2019-11-28 LAB — PROTIME-INR
INR: 0.9 (ref 0.8–1.2)
Prothrombin Time: 12.2 seconds (ref 11.4–15.2)

## 2019-11-28 LAB — APTT: aPTT: 30 seconds (ref 24–36)

## 2019-11-28 LAB — TYPE AND SCREEN
ABO/RH(D): O POS
Antibody Screen: NEGATIVE

## 2019-11-28 NOTE — Progress Notes (Signed)
  [facility] Perioperative Services: Pre-Admission/Anesthesia Testing  Abnormal Lab Notification   Date: 11/28/19  Name: Alisha Shaw MRN:   [id number]  Re: Abnormal labs noted during PAT appointment   Provider(s) Notified: Lyndle Herrlich, MD Notification mode: Routed and/or faxed via [facility]   ABNORMAL LAB VALUE(S): Lab Results  Component Value Date   K 3.1 (L) 11/28/2019   Notes:  Patient is scheduled for a TOTAL HIP ARTHROPLASTY ANTERIOR APPROACH (Right Hip) on 12/03/2019. Patient is on a daily thiazide (HCTZ) for her HTN. Estimated Creatinine Clearance: 69.8 mL/min (by C-G formula based on SCr of 0.64 mg/dL). Will send to surgeon for review and to consider need for optimization. Additionally, will place order to have K+ rechecked the [place] of surgery to ensure correction in efforts to promote a safe anesthetic course.   This is a Personal assistant; no formal response is required.  Quentin Mulling, MSN, APRN, FNP-C, CEN [facility]  Peri-operative Services Nurse Practitioner Phone: [phone] Fax: [phone] 11/28/19 4:18 PM

## 2019-11-28 NOTE — Patient Instructions (Addendum)
Your procedure is scheduled on: 12/03/19-  MONDAY Report to the Registration Desk on the 1st floor of the Medical [facility]. To find out your arrival time, please call [phone] between 1PM - 3PM on: 11/30/19- FRIDAY  REMEMBER: Instructions that are not followed completely may result in serious medical risk, up to and including death; or upon the discretion of your surgeon and anesthesiologist your surgery may need to be rescheduled.  Do not eat food after midnight the night before surgery.  No gum chewing, lozengers or hard candies.  You may however, drink CLEAR liquids up to 2 hours before you are scheduled to arrive for your surgery. Do not drink anything within 2 hours of your scheduled arrival time.  Clear liquids include: - water  - apple juice without pulp - gatorade (not RED, PURPLE, OR BLUE) - black coffee or tea (Do NOT add milk or creamers to the coffee or tea) Do NOT drink anything that is not on this list.  TAKE THESE MEDICATIONS THE MORNING OF SURGERY WITH A SIP OF WATER: - amLODipine (NORVASC) 5 MG tablet - atorvastatin (LIPITOR) 40 MG tablet - famotidine (PEPCID) 20 MG tablet, (take one the night before and one on the morning of surgery - helps to prevent nausea after surgery.) - FLUoxetine (PROZAC) 40 MG capsule - metoprolol tartrate (LOPRESSOR) 25 MG tablet - oxymetazoline (AFRIN) 0.05 % nasal spray - traMADol (ULTRAM) 50 MG tablet IF NEEDED   Use inhaler albuterol (VENTOLIN HFA) 108 (90 Base) MCG/ACT inhaler on the [place] of surgery and bring to the hospital.   STOP TAKING 11/28/19, One week prior to surgery: butalbital-aspirin-caffeine  Stop Anti-inflammatories (NSAIDS) such as Advil, Aleve, Ibuprofen, Motrin, Naproxen, Naprosyn and Aspirin based products such as Excedrin, Goodys Powder, BC Powder.  Stop ANY OVER THE COUNTER supplements until after surgery. (However, you may continue taking Vitamin D, Vitamin B, and multivitamin up until the [place] before  surgery.)  No Alcohol for 24 hours before or after surgery.  No Smoking including e-cigarettes for 24 hours prior to surgery.  No chewable tobacco products for at least 6 hours prior to surgery.  No nicotine patches on the [place] of surgery.  Do not use any "recreational" drugs for at least a week prior to your surgery.  Please be advised that the combination of cocaine and anesthesia may have negative outcomes, up to and including death. If you test positive for cocaine, your surgery will be cancelled.  On the morning of surgery brush your teeth with toothpaste and water, you may rinse your mouth with mouthwash if you wish. Do not swallow any toothpaste or mouthwash.  Do not wear jewelry, make-up, hairpins, clips or nail polish.  Do not wear lotions, powders, or perfumes.   Do not shave body from the neck down 48 hours prior to surgery just in case you cut yourself which could leave a site for infection.  Also, freshly shaved skin may become irritated if using the CHG soap.  Contact lenses, hearing aids and dentures may not be worn into surgery.  Do not bring valuables to the hospital. [facility] is not responsible for any missing/lost belongings or valuables.   Use CHG Soap or wipes as directed on instruction sheet.  Notify your doctor if there is any change in your medical condition (cold, fever, infection).  Wear comfortable clothing (specific to your surgery type) to the hospital.  Plan for stool softeners for home use; pain medications have a tendency to cause constipation. You  can also help prevent constipation by eating foods high in fiber such as fruits and vegetables and drinking plenty of fluids as your diet allows.  After surgery, you can help prevent lung complications by doing breathing exercises.  Take deep breaths and cough every 1-2 hours. Your doctor may order a device called an Incentive Spirometer to help you take deep breaths. When coughing or sneezing, hold a  pillow firmly against your incision with both hands. This is called "splinting." Doing this helps protect your incision. It also decreases belly discomfort.  If you are being admitted to the hospital overnight, leave your suitcase in the car. After surgery it may be brought to your room.  If you are being discharged the [place] of surgery, you will not be allowed to drive home. You will need a responsible adult (18 years or older) to drive you home and stay with you that night.   If you are taking public transportation, you will need to have a responsible adult (18 years or older) with you. Please confirm with your physician that it is acceptable to use public transportation.   Please call the [place] [facility] at [phone] if you have any questions about these instructions.  Visitation Policy:  Patients undergoing a surgery or procedure may have one family member or support person with them as long as that person is not COVID-19 positive or experiencing its symptoms.  That person may remain in the waiting area during the procedure.  Inpatient Visitation Update:   In an effort to ensure the safety of our team members and our patients, we are implementing a change to our visitation policy:  Effective Monday, Aug. 9, at 7 a.m., inpatients will be allowed one support person.  o The support person may change daily.  o The support person must pass our screening, gel in and out, and wear a mask at all times, including in the patient's room.  o Patients must also wear a mask when staff or their support person are in the room.  o Masking is required regardless of vaccination status.  Systemwide, no visitors 17 or younger.

## 2019-11-30 ENCOUNTER — Other Ambulatory Visit
Admission: RE | Admit: 2019-11-30 | Discharge: 2019-11-30 | Disposition: A | Discharge status: Home / Self Care | Payer: Medicare Other | Source: Ambulatory Visit | Attending: Orthopedic Surgery | Admitting: Orthopedic Surgery

## 2019-11-30 ENCOUNTER — Other Ambulatory Visit: Payer: Self-pay

## 2019-11-30 DIAGNOSIS — Z01818 Encounter for other preprocedural examination: Secondary | ICD-10-CM | POA: Insufficient documentation

## 2019-11-30 DIAGNOSIS — Z20822 Contact with and (suspected) exposure to covid-19: Secondary | ICD-10-CM | POA: Diagnosis not present

## 2019-11-30 LAB — SARS CORONAVIRUS 2 (TAT 6-24 HRS): SARS Coronavirus 2: NEGATIVE

## 2019-12-03 ENCOUNTER — Encounter
Admission: RE | Disposition: A | Discharge status: Home / Self Care | Payer: Self-pay | Source: Home / Self Care | Attending: Orthopedic Surgery

## 2019-12-03 ENCOUNTER — Ambulatory Visit: Payer: Medicare Other | Admitting: Urgent Care

## 2019-12-03 ENCOUNTER — Encounter: Payer: Self-pay | Admitting: Orthopedic Surgery

## 2019-12-03 ENCOUNTER — Ambulatory Visit: Payer: Medicare Other

## 2019-12-03 ENCOUNTER — Other Ambulatory Visit: Payer: Self-pay

## 2019-12-03 ENCOUNTER — Observation Stay
Admission: RE | Admit: 2019-12-03 | Discharge: 2019-12-04 | Disposition: A | Discharge status: Home / Self Care | Payer: Medicare Other | Attending: Orthopedic Surgery | Admitting: Orthopedic Surgery

## 2019-12-03 DIAGNOSIS — Z419 Encounter for procedure for purposes other than remedying health state, unspecified: Secondary | ICD-10-CM

## 2019-12-03 DIAGNOSIS — M25551 Pain in right hip: Secondary | ICD-10-CM | POA: Diagnosis present

## 2019-12-03 DIAGNOSIS — M1611 Unilateral primary osteoarthritis, right hip: Secondary | ICD-10-CM | POA: Diagnosis not present

## 2019-12-03 DIAGNOSIS — F17200 Nicotine dependence, unspecified, uncomplicated: Secondary | ICD-10-CM | POA: Diagnosis not present

## 2019-12-03 DIAGNOSIS — J449 Chronic obstructive pulmonary disease, unspecified: Secondary | ICD-10-CM | POA: Diagnosis not present

## 2019-12-03 DIAGNOSIS — I10 Essential (primary) hypertension: Secondary | ICD-10-CM | POA: Insufficient documentation

## 2019-12-03 DIAGNOSIS — Z96641 Presence of right artificial hip joint: Secondary | ICD-10-CM

## 2019-12-03 HISTORY — PX: TOTAL HIP ARTHROPLASTY: SHX124

## 2019-12-03 LAB — POCT I-STAT, CHEM 8
BUN: 17 mg/dL (ref 8–23)
Calcium, Ion: 1.23 mmol/L (ref 1.15–1.40)
Chloride: 98 mmol/L (ref 98–111)
Creatinine, Ser: 0.5 mg/dL (ref 0.44–1.00)
Glucose, Bld: 113 mg/dL — ABNORMAL HIGH (ref 70–99)
HCT: 35 % — ABNORMAL LOW (ref 36.0–46.0)
Hemoglobin: 11.9 g/dL — ABNORMAL LOW (ref 12.0–15.0)
Potassium: 3.7 mmol/L (ref 3.5–5.1)
Sodium: 136 mmol/L (ref 135–145)
TCO2: 24 mmol/L (ref 22–32)

## 2019-12-03 LAB — ABO/RH: ABO/RH(D): O POS

## 2019-12-03 SURGERY — ARTHROPLASTY, HIP, TOTAL, ANTERIOR APPROACH
Anesthesia: Spinal | Site: Hip | Laterality: Right

## 2019-12-03 MED ORDER — DEXMEDETOMIDINE (PRECEDEX) IN NS 20 MCG/5ML (4 MCG/ML) IV SYRINGE
PREFILLED_SYRINGE | INTRAVENOUS | Status: DC | PRN
  Administered 2019-12-03: 12 ug via INTRAVENOUS
  Administered 2019-12-03: 8 ug via INTRAVENOUS

## 2019-12-03 MED ORDER — CEFAZOLIN SODIUM-DEXTROSE 2-4 GM/100ML-% IV SOLN
2.0000 g | INTRAVENOUS | Status: AC
  Administered 2019-12-03: 2 g via INTRAVENOUS

## 2019-12-03 MED ORDER — MAGNESIUM CITRATE PO SOLN
1.0000 | Freq: Once | ORAL | Status: DC | PRN
  Filled 2019-12-03: qty 296

## 2019-12-03 MED ORDER — ALUM & MAG HYDROXIDE-SIMETH 200-200-20 MG/5ML PO SUSP: 30.0000 mL | ORAL | Status: DC | PRN

## 2019-12-03 MED ORDER — FAMOTIDINE 20 MG PO TABS
ORAL_TABLET | ORAL | Status: AC
  Filled 2019-12-03: qty 1

## 2019-12-03 MED ORDER — ACETAMINOPHEN 10 MG/ML IV SOLN
INTRAVENOUS | Status: AC
  Filled 2019-12-03: qty 100

## 2019-12-03 MED ORDER — MAGNESIUM HYDROXIDE 400 MG/5ML PO SUSP: 30.0000 mL | Freq: Every day | ORAL | Status: DC | PRN

## 2019-12-03 MED ORDER — TRANEXAMIC ACID-NACL 1000-0.7 MG/100ML-% IV SOLN
1000.0000 mg | INTRAVENOUS | Status: AC
  Administered 2019-12-03: 1000 mg via INTRAVENOUS

## 2019-12-03 MED ORDER — PROPOFOL 10 MG/ML IV BOLUS
INTRAVENOUS | Status: DC | PRN
  Administered 2019-12-03: 50 mg via INTRAVENOUS

## 2019-12-03 MED ORDER — LACTATED RINGERS IV SOLN: INTRAVENOUS | Status: DC

## 2019-12-03 MED ORDER — OXYMETAZOLINE HCL 0.05 % NA SOLN: 1.0000 | Freq: Every day | NASAL | Status: DC

## 2019-12-03 MED ORDER — METOCLOPRAMIDE HCL 5 MG/ML IJ SOLN: 5.0000 mg | Freq: Three times a day (TID) | INTRAMUSCULAR | Status: DC | PRN

## 2019-12-03 MED ORDER — CHLORHEXIDINE GLUCONATE 0.12 % MT SOLN
OROMUCOSAL | Status: AC
  Administered 2019-12-03: 15 mL via OROMUCOSAL
  Filled 2019-12-03: qty 15

## 2019-12-03 MED ORDER — MORPHINE SULFATE (PF) 4 MG/ML IV SOLN
INTRAVENOUS | Status: AC
  Filled 2019-12-03: qty 1

## 2019-12-03 MED ORDER — ALBUTEROL SULFATE HFA 108 (90 BASE) MCG/ACT IN AERS
2.0000 | INHALATION_SPRAY | Freq: Four times a day (QID) | RESPIRATORY_TRACT | Status: DC | PRN
  Filled 2019-12-03: qty 6.7

## 2019-12-03 MED ORDER — PROPOFOL 500 MG/50ML IV EMUL
INTRAVENOUS | Status: AC
  Filled 2019-12-03: qty 50

## 2019-12-03 MED ORDER — MORPHINE SULFATE (PF) 4 MG/ML IV SOLN
0.5000 mg | INTRAVENOUS | Status: DC | PRN
  Administered 2019-12-03: 1 mg via INTRAVENOUS

## 2019-12-03 MED ORDER — HYDROCHLOROTHIAZIDE 25 MG PO TABS
25.0000 mg | ORAL_TABLET | Freq: Every day | ORAL | Status: DC
  Administered 2019-12-03: 25 mg via ORAL
  Filled 2019-12-03 (×2): qty 1

## 2019-12-03 MED ORDER — HYDROCODONE-ACETAMINOPHEN 5-325 MG PO TABS
1.0000 | ORAL_TABLET | ORAL | Status: DC | PRN
  Administered 2019-12-03: 1 via ORAL

## 2019-12-03 MED ORDER — ATORVASTATIN CALCIUM 20 MG PO TABS
40.0000 mg | ORAL_TABLET | Freq: Every day | ORAL | Status: DC
  Filled 2019-12-03: qty 2

## 2019-12-03 MED ORDER — BISACODYL 10 MG RE SUPP
10.0000 mg | Freq: Every day | RECTAL | Status: DC | PRN
  Filled 2019-12-03: qty 1

## 2019-12-03 MED ORDER — KETOROLAC TROMETHAMINE 15 MG/ML IJ SOLN: 7.5000 mg | Freq: Four times a day (QID) | INTRAMUSCULAR | Status: DC

## 2019-12-03 MED ORDER — ACETAMINOPHEN 325 MG PO TABS: 325.0000 mg | ORAL_TABLET | Freq: Four times a day (QID) | ORAL | Status: DC | PRN

## 2019-12-03 MED ORDER — POVIDONE-IODINE 10 % EX SWAB
2.0000 "application " | Freq: Once | CUTANEOUS | Status: AC
  Administered 2019-12-03: 2 via TOPICAL

## 2019-12-03 MED ORDER — ONDANSETRON HCL 4 MG PO TABS: 4.0000 mg | ORAL_TABLET | Freq: Four times a day (QID) | ORAL | Status: DC | PRN

## 2019-12-03 MED ORDER — FENTANYL CITRATE (PF) 100 MCG/2ML IJ SOLN
INTRAMUSCULAR | Status: AC
  Administered 2019-12-03: 25 ug via INTRAVENOUS
  Filled 2019-12-03: qty 2

## 2019-12-03 MED ORDER — HYDROCODONE-ACETAMINOPHEN 5-325 MG PO TABS
ORAL_TABLET | ORAL | Status: AC
  Administered 2019-12-03: 1 via ORAL
  Filled 2019-12-03: qty 1

## 2019-12-03 MED ORDER — ONDANSETRON HCL 4 MG/2ML IJ SOLN: 4.0000 mg | Freq: Once | INTRAMUSCULAR | Status: DC | PRN

## 2019-12-03 MED ORDER — CEFAZOLIN SODIUM-DEXTROSE 2-4 GM/100ML-% IV SOLN
INTRAVENOUS | Status: AC
  Administered 2019-12-03: 2 g via INTRAVENOUS
  Filled 2019-12-03: qty 100

## 2019-12-03 MED ORDER — FLUOXETINE HCL 20 MG PO CAPS
40.0000 mg | ORAL_CAPSULE | Freq: Every day | ORAL | Status: DC
  Filled 2019-12-03: qty 2

## 2019-12-03 MED ORDER — PHENYLEPHRINE HCL (PRESSORS) 10 MG/ML IV SOLN
INTRAVENOUS | Status: DC | PRN
  Administered 2019-12-03: 100 ug via INTRAVENOUS

## 2019-12-03 MED ORDER — FAMOTIDINE 20 MG PO TABS
20.0000 mg | ORAL_TABLET | Freq: Two times a day (BID) | ORAL | Status: DC
  Administered 2019-12-03: 20 mg via ORAL

## 2019-12-03 MED ORDER — EPHEDRINE SULFATE 50 MG/ML IJ SOLN
INTRAMUSCULAR | Status: DC | PRN
  Administered 2019-12-03 (×2): 7.5 mg via INTRAVENOUS
  Administered 2019-12-03: 10 mg via INTRAVENOUS

## 2019-12-03 MED ORDER — ONDANSETRON HCL 4 MG/2ML IJ SOLN
INTRAMUSCULAR | Status: DC | PRN
  Administered 2019-12-03: 4 mg via INTRAVENOUS

## 2019-12-03 MED ORDER — SODIUM CHLORIDE 0.9 % IV SOLN
INTRAVENOUS | Status: DC | PRN
  Administered 2019-12-03: 25 ug/min via INTRAVENOUS

## 2019-12-03 MED ORDER — DEXMEDETOMIDINE (PRECEDEX) IN NS 20 MCG/5ML (4 MCG/ML) IV SYRINGE
PREFILLED_SYRINGE | INTRAVENOUS | Status: AC
  Filled 2019-12-03: qty 5

## 2019-12-03 MED ORDER — PROPOFOL 500 MG/50ML IV EMUL
INTRAVENOUS | Status: DC | PRN
  Administered 2019-12-03: 50 ug/kg/min via INTRAVENOUS

## 2019-12-03 MED ORDER — PHENOL 1.4 % MT LIQD
1.0000 | OROMUCOSAL | Status: DC | PRN
  Filled 2019-12-03: qty 177

## 2019-12-03 MED ORDER — ASPIRIN 81 MG PO CHEW
CHEWABLE_TABLET | ORAL | Status: AC
  Filled 2019-12-03: qty 1

## 2019-12-03 MED ORDER — ORAL CARE MOUTH RINSE: 15.0000 mL | Freq: Once | OROMUCOSAL | Status: AC

## 2019-12-03 MED ORDER — METOCLOPRAMIDE HCL 10 MG PO TABS: 5.0000 mg | ORAL_TABLET | Freq: Three times a day (TID) | ORAL | Status: DC | PRN

## 2019-12-03 MED ORDER — PROPOFOL 10 MG/ML IV BOLUS
INTRAVENOUS | Status: AC
  Filled 2019-12-03: qty 40

## 2019-12-03 MED ORDER — TRANEXAMIC ACID-NACL 1000-0.7 MG/100ML-% IV SOLN
INTRAVENOUS | Status: AC
  Filled 2019-12-03: qty 100

## 2019-12-03 MED ORDER — HYDROCODONE-ACETAMINOPHEN 7.5-325 MG PO TABS
1.0000 | ORAL_TABLET | ORAL | Status: DC | PRN
  Administered 2019-12-03: 1 via ORAL
  Administered 2019-12-04: 2 via ORAL

## 2019-12-03 MED ORDER — BUPIVACAINE HCL (PF) 0.5 % IJ SOLN
INTRAMUSCULAR | Status: DC | PRN
  Administered 2019-12-03: 2.5 mL via INTRATHECAL

## 2019-12-03 MED ORDER — FENTANYL CITRATE (PF) 100 MCG/2ML IJ SOLN
25.0000 ug | INTRAMUSCULAR | Status: DC | PRN
  Administered 2019-12-03 (×2): 25 ug via INTRAVENOUS

## 2019-12-03 MED ORDER — ASPIRIN 81 MG PO CHEW
81.0000 mg | CHEWABLE_TABLET | Freq: Two times a day (BID) | ORAL | Status: DC
  Administered 2019-12-03: 81 mg via ORAL

## 2019-12-03 MED ORDER — DOCUSATE SODIUM 100 MG PO CAPS
100.0000 mg | ORAL_CAPSULE | Freq: Two times a day (BID) | ORAL | Status: DC
  Administered 2019-12-03: 100 mg via ORAL
  Filled 2019-12-03 (×3): qty 1

## 2019-12-03 MED ORDER — FENTANYL CITRATE (PF) 100 MCG/2ML IJ SOLN
INTRAMUSCULAR | Status: DC | PRN
  Administered 2019-12-03 (×2): 25 ug via INTRAVENOUS

## 2019-12-03 MED ORDER — FENTANYL CITRATE (PF) 100 MCG/2ML IJ SOLN
INTRAMUSCULAR | Status: AC
  Filled 2019-12-03: qty 2

## 2019-12-03 MED ORDER — METOPROLOL TARTRATE 25 MG PO TABS
25.0000 mg | ORAL_TABLET | Freq: Two times a day (BID) | ORAL | Status: DC
  Administered 2019-12-03: 25 mg via ORAL

## 2019-12-03 MED ORDER — HYDROCODONE-ACETAMINOPHEN 7.5-325 MG PO TABS
ORAL_TABLET | ORAL | Status: AC
  Filled 2019-12-03: qty 1

## 2019-12-03 MED ORDER — LOSARTAN POTASSIUM 50 MG PO TABS
100.0000 mg | ORAL_TABLET | Freq: Every day | ORAL | Status: DC
  Administered 2019-12-03: 100 mg via ORAL
  Filled 2019-12-03 (×2): qty 2

## 2019-12-03 MED ORDER — AMLODIPINE BESYLATE 5 MG PO TABS
5.0000 mg | ORAL_TABLET | Freq: Every day | ORAL | Status: DC
  Filled 2019-12-03: qty 1

## 2019-12-03 MED ORDER — GLYCOPYRROLATE 0.2 MG/ML IJ SOLN
INTRAMUSCULAR | Status: DC | PRN
  Administered 2019-12-03: .2 mg via INTRAVENOUS

## 2019-12-03 MED ORDER — ATROPINE SULFATE 1 % OP SOLN
1.0000 [drp] | OPHTHALMIC | Status: DC
  Filled 2019-12-03: qty 2

## 2019-12-03 MED ORDER — LIDOCAINE HCL (CARDIAC) PF 100 MG/5ML IV SOSY
PREFILLED_SYRINGE | INTRAVENOUS | Status: DC | PRN
  Administered 2019-12-03: 60 mg via INTRAVENOUS

## 2019-12-03 MED ORDER — MENTHOL 3 MG MT LOZG
1.0000 | LOZENGE | OROMUCOSAL | Status: DC | PRN
  Filled 2019-12-03: qty 9

## 2019-12-03 MED ORDER — ACETAMINOPHEN 10 MG/ML IV SOLN
INTRAVENOUS | Status: DC | PRN
  Administered 2019-12-03: 1000 mg via INTRAVENOUS

## 2019-12-03 MED ORDER — BUPIVACAINE-EPINEPHRINE (PF) 0.25% -1:200000 IJ SOLN
INTRAMUSCULAR | Status: DC | PRN
  Administered 2019-12-03: 20 mL

## 2019-12-03 MED ORDER — METOPROLOL TARTRATE 25 MG PO TABS
ORAL_TABLET | ORAL | Status: AC
  Filled 2019-12-03: qty 1

## 2019-12-03 MED ORDER — HYDROCODONE-ACETAMINOPHEN 7.5-325 MG PO TABS
ORAL_TABLET | ORAL | Status: AC
  Administered 2019-12-03: 1 via ORAL
  Filled 2019-12-03: qty 1

## 2019-12-03 MED ORDER — KETOROLAC TROMETHAMINE 15 MG/ML IJ SOLN
INTRAMUSCULAR | Status: AC
  Administered 2019-12-03: 7.5 mg via INTRAVENOUS
  Filled 2019-12-03: qty 1

## 2019-12-03 MED ORDER — CEFAZOLIN SODIUM-DEXTROSE 2-4 GM/100ML-% IV SOLN: 2.0000 g | Freq: Four times a day (QID) | INTRAVENOUS | Status: AC

## 2019-12-03 MED ORDER — CHLORHEXIDINE GLUCONATE 0.12 % MT SOLN: 15.0000 mL | Freq: Once | OROMUCOSAL | Status: AC

## 2019-12-03 MED ORDER — CEFAZOLIN SODIUM-DEXTROSE 2-4 GM/100ML-% IV SOLN
INTRAVENOUS | Status: AC
  Filled 2019-12-03: qty 100

## 2019-12-03 MED ORDER — HYDROCODONE-ACETAMINOPHEN 5-325 MG PO TABS
ORAL_TABLET | ORAL | Status: AC
  Filled 2019-12-03: qty 1

## 2019-12-03 MED ORDER — ONDANSETRON HCL 4 MG/2ML IJ SOLN: 4.0000 mg | Freq: Four times a day (QID) | INTRAMUSCULAR | Status: DC | PRN

## 2019-12-03 SURGICAL SUPPLY — 52 items
APL PRP STRL LF DISP 70% ISPRP (MISCELLANEOUS) ×1
BLADE SAGITTAL WIDE XTHICK NO (BLADE) ×2 IMPLANT
BRUSH SCRUB EZ  4% CHG (MISCELLANEOUS) ×4
BRUSH SCRUB EZ 4% CHG (MISCELLANEOUS) ×2 IMPLANT
CHLORAPREP W/TINT 26 (MISCELLANEOUS) ×2 IMPLANT
COVER HOLE (Hips) ×2 IMPLANT
COVER WAND RF STERILE (DRAPES) ×2 IMPLANT
DRAPE 3/4 80X56 (DRAPES) ×2 IMPLANT
DRAPE C-ARM 42X72 X-RAY (DRAPES) ×2 IMPLANT
DRAPE STERI IOBAN 125X83 (DRAPES) IMPLANT
DRSG AQUACEL AG ADV 3.5X10 (GAUZE/BANDAGES/DRESSINGS) IMPLANT
DRSG AQUACEL AG ADV 3.5X14 (GAUZE/BANDAGES/DRESSINGS) IMPLANT
ELECT BLADE 6.5 EXT (BLADE) ×2 IMPLANT
ELECT REM PT RETURN 9FT ADLT (ELECTROSURGICAL) ×2
ELECTRODE REM PT RTRN 9FT ADLT (ELECTROSURGICAL) ×1 IMPLANT
GAUZE XEROFORM 1X8 LF (GAUZE/BANDAGES/DRESSINGS) IMPLANT
GLOVE INDICATOR 8.0 STRL GRN (GLOVE) ×2 IMPLANT
GLOVE SURG ORTHO 8.0 STRL STRW (GLOVE) ×4 IMPLANT
GOWN STRL REUS W/ TWL LRG LVL3 (GOWN DISPOSABLE) ×1 IMPLANT
GOWN STRL REUS W/ TWL XL LVL3 (GOWN DISPOSABLE) ×1 IMPLANT
GOWN STRL REUS W/TWL LRG LVL3 (GOWN DISPOSABLE) ×2
GOWN STRL REUS W/TWL XL LVL3 (GOWN DISPOSABLE) ×2
HEAD OXINIUM PLUS 0 32MM (Hips) ×2 IMPLANT
HOOD PEEL AWAY FLYTE STAYCOOL (MISCELLANEOUS) ×6 IMPLANT
IRRIGATION SURGIPHOR STRL (IV SOLUTION) IMPLANT
IV NS 1000ML (IV SOLUTION) ×2
IV NS 1000ML BAXH (IV SOLUTION) ×1 IMPLANT
KIT PATIENT CARE HANA TABLE (KITS) ×2 IMPLANT
KIT TURNOVER CYSTO (KITS) ×2 IMPLANT
LINER 3H HEMI SHELL 48MM (Liner) ×2 IMPLANT
LINER ACETABULAR 32X48 (Liner) ×2 IMPLANT
MANIFOLD NEPTUNE II (INSTRUMENTS) ×2 IMPLANT
MAT ABSORB  FLUID 56X50 GRAY (MISCELLANEOUS) ×2
MAT ABSORB FLUID 56X50 GRAY (MISCELLANEOUS) ×1 IMPLANT
NDL SAFETY ECLIPSE 18X1.5 (NEEDLE) ×2 IMPLANT
NEEDLE HYPO 18GX1.5 SHARP (NEEDLE) ×4
NEEDLE HYPO 22GX1.5 SAFETY (NEEDLE) ×2 IMPLANT
NEEDLE SPNL 20GX3.5 QUINCKE YW (NEEDLE) ×2 IMPLANT
PACK HIP PROSTHESIS (MISCELLANEOUS) ×2 IMPLANT
PADDING CAST BLEND 4X4 NS (MISCELLANEOUS) ×4 IMPLANT
PILLOW ABDUCTION MEDIUM (MISCELLANEOUS) ×2 IMPLANT
PULSAVAC PLUS IRRIG FAN TIP (DISPOSABLE) ×2
SCREW 6.5X25MM (Screw) ×2 IMPLANT
STAPLER SKIN PROX 35W (STAPLE) ×2 IMPLANT
STEM POLAR STD SZ4 COLLAR (Stent) ×2 IMPLANT
SUT BONE WAX W31G (SUTURE) ×2 IMPLANT
SUT DVC 2 QUILL PDO  T11 36X36 (SUTURE) ×2
SUT DVC 2 QUILL PDO T11 36X36 (SUTURE) ×1 IMPLANT
SUT VIC AB 2-0 CT1 18 (SUTURE) ×2 IMPLANT
SYR 20ML LL LF (SYRINGE) ×2 IMPLANT
SYR BULB IRRIG 60ML STRL (SYRINGE) ×2 IMPLANT
TIP FAN IRRIG PULSAVAC PLUS (DISPOSABLE) ×1 IMPLANT

## 2019-12-03 NOTE — Plan of Care (Signed)
Patient just arrived to room.

## 2019-12-03 NOTE — Anesthesia Preprocedure Evaluation (Signed)
Anesthesia Evaluation  Patient identified by MRN, date of birth, ID band Patient awake    Reviewed: Allergy & Precautions, NPO status , Patient's Chart, lab work & pertinent test results  History of Anesthesia Complications (+) PONV and history of anesthetic complications  Airway Mallampati: III  TM Distance: >3 FB Neck ROM: Full    Dental  (+) Poor Dentition, Missing   Pulmonary Sleep apnea: suggestive hx. , COPD, Current Smoker and Patient abstained from smoking.,    breath sounds clear to auscultation- rhonchi (-) wheezing      Cardiovascular hypertension, Pt. on medications (-) CAD, (-) Past MI, (-) Cardiac Stents and (-) CABG  Rhythm:Regular Rate:Normal - Systolic murmurs and - Diastolic murmurs    Neuro/Psych  Headaches, PSYCHIATRIC DISORDERS Anxiety Depression    GI/Hepatic Neg liver ROS, GERD  ,  Endo/Other  negative endocrine ROSneg diabetes  Renal/GU negative Renal ROS     Musculoskeletal negative musculoskeletal ROS (+)   Abdominal (+) + obese,   Peds  Hematology negative hematology ROS (+)   Anesthesia Other Findings Past Medical History: No date: Anxiety No date: COPD (chronic obstructive pulmonary disease) (HCC) No date: Depression No date: Family history of adverse reaction to anesthesia     Comment:  sister difficult to wake up No date: GERD (gastroesophageal reflux disease)     Comment:  indigestion at times not related to food No date: Headache(784.0)     Comment:  history of migranes No date: History of kidney stones     Comment:  passed without issue No date: Hypertension No date: Palpitations with regular cardiac rhythm No date: PONV (postoperative nausea and vomiting)     Comment:  1978 after gallbladder surgery   Reproductive/Obstetrics                             Lab Results  Component Value Date   WBC 8.0 11/28/2019   HGB 11.9 (L) 12/03/2019   HCT 35.0  (L) 12/03/2019   MCV 97.3 11/28/2019   PLT 248 11/28/2019    Anesthesia Physical Anesthesia Plan  ASA: III  Anesthesia Plan: Spinal   Post-op Pain Management:    Induction:   PONV Risk Score and Plan: 2 and Propofol infusion  Airway Management Planned: Natural Airway  Additional Equipment:   Intra-op Plan:   Post-operative Plan:   Informed Consent: I have reviewed the patients History and Physical, chart, labs and discussed the procedure including the risks, benefits and alternatives for the proposed anesthesia with the patient or authorized representative who has indicated his/her understanding and acceptance.     Dental advisory given  Plan Discussed with: CRNA and Anesthesiologist  Anesthesia Plan Comments:         Anesthesia Quick Evaluation

## 2019-12-03 NOTE — Anesthesia Postprocedure Evaluation (Signed)
Anesthesia Post Note  Patient: Phylis Javed Renteria  Procedure(s) Performed: TOTAL HIP ARTHROPLASTY ANTERIOR APPROACH (Right Hip)  Patient location during evaluation: PACU Anesthesia Type: Spinal Level of consciousness: awake and alert Pain management: pain level controlled Vital Signs Assessment: post-procedure vital signs reviewed and stable Respiratory status: spontaneous breathing, nonlabored ventilation, respiratory function stable and patient connected to nasal cannula oxygen Cardiovascular status: blood pressure returned to baseline and stable Postop Assessment: no apparent nausea or vomiting Anesthetic complications: no   No complications documented.   Last Vitals:  Vitals:   12/03/19 1712 12/03/19 1848  BP: (!) 108/55 (!) 145/53  Pulse: 63 77  Resp: 16 16  Temp: 36.6 C 36.6 C  SpO2: 96% 94%    Last Pain:  Vitals:   12/03/19 1735  TempSrc:   PainSc: 6                  Yevette Edwards

## 2019-12-03 NOTE — Transfer of Care (Signed)
Immediate Anesthesia Transfer of Care Note  Patient: Alisha Shaw  Procedure(s) Performed: TOTAL HIP ARTHROPLASTY ANTERIOR APPROACH (Right Hip)  Patient Location: PACU  Anesthesia Type:Spinal  Level of Consciousness: awake, alert  and oriented  Airway & Oxygen Therapy: Patient Spontanous Breathing and Patient connected to face mask oxygen  Post-op Assessment: Report given to RN and Post -op Vital signs reviewed and stable  Post vital signs: Reviewed and stable  Last Vitals:  Vitals Value Taken Time  BP 114/87 12/03/19 1600  Temp 36.6 C 12/03/19 1600  Pulse 66 12/03/19 1601  Resp 16 12/03/19 1601  SpO2 99 % 12/03/19 1601  Vitals shown include unvalidated device data.  Last Pain:  Vitals:   12/03/19 1027  TempSrc: Temporal  PainSc: 7          Complications: No complications documented.

## 2019-12-03 NOTE — H&P (Signed)
The patient has been re-examined, and the chart reviewed, and there have been no interval changes to the documented history and physical.  Plan a right total hip today.  Anesthesia is not consulted regarding a peripheral nerve block for post-operative pain.  The risks, benefits, and alternatives have been discussed at length, and the patient is willing to proceed.    

## 2019-12-03 NOTE — Anesthesia Procedure Notes (Signed)
Spinal  Patient location during procedure: OR Start time: 12/03/2019 1:46 PM End time: 12/03/2019 1:51 PM Staffing Performed: resident/CRNA  Anesthesiologist: Alver Fisher, MD Resident/CRNA: Henrietta Hoover, CRNA Preanesthetic Checklist Completed: patient identified, IV checked, site marked, risks and benefits discussed, surgical consent, monitors and equipment checked, pre-op evaluation and timeout performed Spinal Block Patient position: sitting Prep: ChloraPrep Patient monitoring: heart rate, continuous pulse ox and blood pressure Approach: midline Location: L3-4 Injection technique: single-shot Needle Needle type: Pencil-Tip and Introducer  Needle gauge: 24 G Needle length: 9 cm Assessment Sensory level: T4

## 2019-12-03 NOTE — Op Note (Signed)
12/03/2019  3:58 PM  PATIENT:  Alisha Shaw   MRN: [id number]  PRE-OPERATIVE DIAGNOSIS:  Osteoarthritis right hip   POST-OPERATIVE DIAGNOSIS: Same  Procedure: Right Total Hip Replacement  Surgeon: Dola Argyle. Odis Luster, MD   Assist: Altamese Cabal, PA-C  Anesthesia: Spinal   EBL: 150 mL   Specimens: None   Drains: None   Components used: A size 4 Polarstem [employer]hew, R3 size 48 mm shell, and a 32 mm +0 mm head    Description of the procedure in detail: After informed consent was obtained and the appropriate extremity marked in the pre-operative holding area, the patient was taken to the operating room and placed in the supine position on the fracture table. All pressure points were well padded and bilateral lower extremities were place in traction spars. The hip was prepped and draped in standard sterile fashion. A spinal anesthetic had been delivered by the anesthesia team. The skin and subcutaneous tissues were injected with a mixture of Marcaine with epinephrine for post-operative pain. A longitudinal incision approximately 10 cm in length was carried out from the anterior superior iliac spine to the greater trochanter. The tensor fascia was divided and blunt dissection was taken down to the level of the joint capsule. The lateral circumflex vessels were cauterized. Deep retractors were placed and a portion of the anterior capsule was excised. Using fluoroscopy the neck cut was planned and carried out with a sagittal saw. The head was passed from the field with use of a corkscrew and hip skid. Deep retractors were placed along the acetabulum and the degenerative labrum and large osteophytes were removed with a Rongeur. The cup was sequentially reamed to a size 48 mm. The wound was irrigated and using fluoroscopy the size 48 mm cup was impacted in to anatomic position. A single screw was placed followed by a threaded hole cover. The final liner was impacted in to position. Attention was  then turned to the proximal femur. The leg was placed in extension and external rotation. The canal was opened and sequentially broached to a size 4. The trial components were placed and the hip relocated. The components were found to be in good position using fluoroscopy. The hip was dislocated and the trial components removed. The final components were impacted in to position and the hip relocated. The final components were again check with fluoroscopy and found to be in good position. Hemostasis was achieved with electrocautery. The deep capsule was injected with Marcaine and epinephrine. The wound was irrigated with bacitracin laced normal saline and the tensor fascia closed with #2 Quill suture. The subcutaneous tissues were closed with 2-0 vicryl and staples for the skin. A sterile dressing was applied and an abduction pillow. Patient tolerated the procedure well and there were no apparent complication. Patient was taken to the recovery room in good condition.   Cassell Smiles, MD

## 2019-12-04 ENCOUNTER — Encounter: Payer: Self-pay | Admitting: Orthopedic Surgery

## 2019-12-04 DIAGNOSIS — M1611 Unilateral primary osteoarthritis, right hip: Secondary | ICD-10-CM | POA: Diagnosis not present

## 2019-12-04 LAB — CBC
HCT: 27.2 % — ABNORMAL LOW (ref 36.0–46.0)
Hemoglobin: 9.7 g/dL — ABNORMAL LOW (ref 12.0–15.0)
MCH: 35.5 pg — ABNORMAL HIGH (ref 26.0–34.0)
MCHC: 35.7 g/dL (ref 30.0–36.0)
MCV: 99.6 fL (ref 80.0–100.0)
Platelets: 185 10*3/uL (ref 150–400)
RBC: 2.73 MIL/uL — ABNORMAL LOW (ref 3.87–5.11)
RDW: 13.5 % (ref 11.5–15.5)
WBC: 10.4 10*3/uL (ref 4.0–10.5)
nRBC: 0 % (ref 0.0–0.2)

## 2019-12-04 LAB — BASIC METABOLIC PANEL
Anion gap: 11 (ref 5–15)
BUN: 15 mg/dL (ref 8–23)
CO2: 26 mmol/L (ref 22–32)
Calcium: 8.7 mg/dL — ABNORMAL LOW (ref 8.9–10.3)
Chloride: 95 mmol/L — ABNORMAL LOW (ref 98–111)
Creatinine, Ser: 0.71 mg/dL (ref 0.44–1.00)
GFR, Estimated: >60 mL/min (ref >60)
Glucose, Bld: 109 mg/dL — ABNORMAL HIGH (ref 70–99)
Potassium: 3.5 mmol/L (ref 3.5–5.1)
Sodium: 132 mmol/L — ABNORMAL LOW (ref 135–145)

## 2019-12-04 MED ORDER — DOCUSATE SODIUM 100 MG PO CAPS: 100.0000 mg | ORAL_CAPSULE | Freq: Two times a day (BID) | ORAL | 0 refills | Status: AC

## 2019-12-04 MED ORDER — HYDROCODONE-ACETAMINOPHEN 7.5-325 MG PO TABS
ORAL_TABLET | ORAL | Status: AC
  Administered 2019-12-04: 2 via ORAL
  Filled 2019-12-04: qty 2

## 2019-12-04 MED ORDER — ASPIRIN 81 MG PO CHEW: 81.0000 mg | CHEWABLE_TABLET | Freq: Two times a day (BID) | ORAL | 0 refills | Status: AC

## 2019-12-04 MED ORDER — HYDROCODONE-ACETAMINOPHEN 7.5-325 MG PO TABS
1.0000 | ORAL_TABLET | ORAL | 0 refills | Status: AC | PRN
Start: 2019-12-04 — End: ?

## 2019-12-04 MED ORDER — CEFAZOLIN SODIUM-DEXTROSE 2-4 GM/100ML-% IV SOLN
INTRAVENOUS | Status: AC
  Administered 2019-12-04: 2 g via INTRAVENOUS
  Filled 2019-12-04: qty 100

## 2019-12-04 MED ORDER — HYDROCODONE-ACETAMINOPHEN 7.5-325 MG PO TABS
ORAL_TABLET | ORAL | Status: AC
  Filled 2019-12-04: qty 2

## 2019-12-04 MED ORDER — TIZANIDINE HCL 4 MG PO TABS
4.0000 mg | ORAL_TABLET | Freq: Every day | ORAL | 1 refills | Status: DC
Start: 2019-12-04 — End: 2020-02-08

## 2019-12-04 MED ORDER — KETOROLAC TROMETHAMINE 15 MG/ML IJ SOLN
INTRAMUSCULAR | Status: AC
  Administered 2019-12-04: 7.5 mg via INTRAVENOUS
  Filled 2019-12-04: qty 1

## 2019-12-04 NOTE — Plan of Care (Signed)
Patient met and exceeded all criteria

## 2019-12-04 NOTE — Evaluation (Addendum)
Physical Therapy Evaluation Patient Details Name: Alisha Shaw MRN: [id number] DOB: 06-21-1950 Today's Date: 12/04/2019   History of Present Illness  Patient is a 69 year old female with unilateral primary osteoarthritis, right hip. s/p total hip replacement. Past medical history significant for COPD, current smoker, hypertension, GERD, obesity, anxiety, depression.    Clinical Impression  Patient eager to participate with PT and be discharged home as soon as possible. Spouse at the bedside and present throughout evaluation. Patient is previously independent with mobility without assistive device prior to surgery. Patient is anticipated to discharge home after therapy per notes. Patient is Mod I for transfers from multiple surfaces. Gait training completed in hallway with front wheeled rolling walker and occasional cues for safety and use of rolling walker. Patient ambulated 269f with 4/10 pain reported in right hip. Stair training completed with supervision from therapist and initial verbal cues for technique, in preparation for community mobility. Issued home exercise program and reviewed with patient and spouse. Patient is demonstrating high level of functional mobility post-op and is eager to be discharged home as soon as possible. Patient has bed side commode and rolling walker in place at home with no additional DME needs identified at this time. Recommend HHPT at discharge. No further acute PT needs at this time as patient is anticipated to discharge.     Follow Up Recommendations Home health PT    Equipment Recommendations   (patient has DME at home already, no additional DME needed. )    Recommendations for Other Services       Precautions / Restrictions Precautions Precautions: Anterior Hip;Fall Precaution Booklet Issued: Yes (comment) Restrictions Weight Bearing Restrictions: Yes RLE Weight Bearing: Weight bearing as tolerated Other Position/Activity Restrictions: no hip  precautions       Mobility  Bed Mobility               General bed mobility comments: not observed as patient sitting up on arrival and post session     Transfers Overall transfer level: Modified independent Equipment used: Rolling walker (2 wheeled)             General transfer comment: multiple transfers performed from various surfaces including bed and transport chair. verbal cues for hand placement for safety   Ambulation/Gait Ambulation/Gait assistance: Min guard;Supervision (Min guard to supervision ) Gait Distance (Feet): 250 Feet Assistive device: Rolling walker (2 wheeled) Gait Pattern/deviations: Antalgic;Decreased stance time - right;Step-to pattern Gait velocity: decreased    General Gait Details: Min guard initially progressing to supervision with increased ambulation distance. With initial verbal cues for rolling walker positioning, patient demonstrated correct technique. Increased knee flexion with stance phase of gait on right leg   Stairs Stairs: Yes Stairs assistance: Supervision Stair Management: Two rails;Step to pattern Number of Stairs: 4 General stair comments: With initial verbal instruction and cues, patient demonstrated correct technique going up/down 4 steps wiht 2 rails in preparation for community mobility. Patient has only one step up to enter home   Wheelchair Mobility    Modified Rankin (Stroke Patients Only)       Balance Overall balance assessment: Needs assistance Sitting-balance support: No upper extremity supported;Feet supported Sitting balance-Leahy Scale: Normal     Standing balance support: Bilateral upper extremity supported Standing balance-Leahy Scale: Fair Standing balance comment: patient does rely on rolling walker for UE support in standing position  Pertinent Vitals/Pain Pain Assessment: 0-10 Pain Score: 4  Pain Location: right hip  Pain Descriptors / Indicators:  Sore Pain Intervention(s): Limited activity within patient's tolerance;Monitored during session (encouraged ice packs )    Home Living Family/patient expects to be discharged to:: Private residence Living Arrangements: Spouse/significant other Available Help at Discharge: Family;Available 24 hours/[place] Type of Home: House Home Access:  (one step up )     Home Layout: One level Home Equipment: Walker - 2 wheels;Bedside commode      Prior Function Level of Independence: Independent               Hand Dominance        Extremity/Trunk Assessment   Upper Extremity Assessment Upper Extremity Assessment: Overall WFL for tasks assessed    Lower Extremity Assessment Lower Extremity Assessment: RLE deficits/detail;LLE deficits/detail RLE Deficits / Details: patient able to activate hip, knee, ankle movement in sitting and standing position. no knee buckling with weight bearing  RLE Sensation: decreased light touch (anterior distal thigh mild numbness per patient report ) LLE Deficits / Details: within functional limits  LLE Sensation: WNL       Communication   Communication: No difficulties  Cognition Arousal/Alertness: Awake/alert Behavior During Therapy: WFL for tasks assessed/performed Overall Cognitive Status: Within Functional Limits for tasks assessed                                        General Comments      Exercises Total Joint Exercises Ankle Circles/Pumps: AROM;Strengthening;Right;10 reps;Seated Other Exercises Other Exercises: issued post-op total hip home exercise packet and reviewed with patient and spouse    Assessment/Plan    PT Assessment All further PT needs can be met in the next venue of care  PT Problem List Decreased strength;Decreased range of motion;Decreased activity tolerance;Decreased balance;Decreased mobility;Decreased knowledge of use of DME;Decreased safety awareness;Pain       PT Treatment Interventions       PT Goals (Current goals can be found in the Care Plan section)  Acute Rehab PT Goals Patient Stated Goal: to go home today  PT Goal Formulation: With patient/family Time For Goal Achievement: 12/04/19 Potential to Achieve Goals: Good    Frequency     Barriers to discharge        Co-evaluation               AM-PAC PT "6 Clicks" Mobility  Outcome Measure Help needed turning from your back to your side while in a flat bed without using bedrails?: A Little Help needed moving from lying on your back to sitting on the side of a flat bed without using bedrails?: A Little Help needed moving to and from a bed to a chair (including a wheelchair)?: A Little Help needed standing up from a chair using your arms (e.g., wheelchair or bedside chair)?: A Little Help needed to walk in hospital room?: A Little Help needed climbing 3-5 steps with a railing? : A Little 6 Click Score: 18    End of Session Equipment Utilized During Treatment: Gait belt Activity Tolerance: Patient tolerated treatment well Patient left:  (sitting up on bed eating breakfast with spouse at bedside ) Nurse Communication: Mobility status PT Visit Diagnosis: Other abnormalities of gait and mobility (R26.89);Pain Pain - Right/Left: Right Pain - part of body: Hip    Time: 3825-0539 PT Time Calculation (min) (ACUTE ONLY): 36  min   Charges:   PT Evaluation $PT Eval Moderate Complexity: 1 Mod PT Treatments $Gait Training: 8-22 mins        Minna Merritts, PT, MPT   Percell Locus 12/04/2019, 9:28 AM

## 2019-12-04 NOTE — Discharge Summary (Signed)
Physician Discharge Summary  Patient ID: Alisha Shaw MRN: [id number] DOB/AGE: 1950/03/05 69 y.o.  Admit date: 12/03/2019 Discharge date: 12/04/2019  Admission Diagnoses:  M16.11 Unilateral primary osteoarthritis, right hip <principal problem not specified>  Discharge Diagnoses:  M16.11 Unilateral primary osteoarthritis, right hip Active Problems:   History of total hip replacement, right   Past Medical History:  Diagnosis Date  . Anxiety   . COPD (chronic obstructive pulmonary disease) (HCC)   . Depression   . Family history of adverse reaction to anesthesia    sister difficult to wake up  . GERD (gastroesophageal reflux disease)    indigestion at times not related to food  . Headache(784.0)    history of migranes  . History of kidney stones    passed without issue  . Hypertension   . Palpitations with regular cardiac rhythm   . PONV (postoperative nausea and vomiting)    1978 after gallbladder surgery    Surgeries: Procedure(s): TOTAL HIP ARTHROPLASTY ANTERIOR APPROACH on 12/03/2019   Consultants (if any):   Discharged Condition: Improved  Hospital Course: Masen Salvas Fess is an 69 y.o. female who was admitted 12/03/2019 with a diagnosis of  M16.11 Unilateral primary osteoarthritis, right hip <principal problem not specified> and went to the operating room on 12/03/2019 and underwent the above named procedures.    She was given perioperative antibiotics:  Anti-infectives (From admission, onward)   Start     Dose/Rate Route Frequency Ordered Stop   12/03/19 2000  ceFAZolin (ANCEF) IVPB 2g/100 mL premix        2 g 200 mL/hr over 30 Minutes Intravenous Every 6 hours 12/03/19 1717 12/04/19 0240   12/03/19 1016  ceFAZolin (ANCEF) 2-4 GM/100ML-% IVPB       Note to Pharmacy: Malva Limes   : cabinet override      12/03/19 1016 12/03/19 1356   12/03/19 1015  ceFAZolin (ANCEF) IVPB 2g/100 mL premix        2 g 200 mL/hr over 30 Minutes Intravenous On call to O.R.  12/03/19 1000 12/03/19 1356    .  She was given sequential compression devices, early ambulation, and Aspirin for DVT prophylaxis.  She benefited maximally from the hospital stay and there were no complications.    Recent vital signs:  Vitals:   12/04/19 0206 12/04/19 0512  BP: 129/65 126/63  Pulse: 68 63  Resp: 18 16  Temp: 98.2 F (36.8 C) 97.9 F (36.6 C)  SpO2: 96% 96%    Recent laboratory studies:  Lab Results  Component Value Date   HGB 9.7 (L) 12/04/2019   HGB 11.9 (L) 12/03/2019   HGB 12.8 11/28/2019   Lab Results  Component Value Date   WBC 10.4 12/04/2019   PLT 185 12/04/2019   Lab Results  Component Value Date   INR 0.9 11/28/2019   Lab Results  Component Value Date   NA 136 12/03/2019   K 3.7 12/03/2019   CL 98 12/03/2019   CO2 27 11/28/2019   BUN 17 12/03/2019   CREATININE 0.50 12/03/2019   GLUCOSE 113 (H) 12/03/2019    Discharge Medications:   Allergies as of 12/04/2019      Reactions   Ciprofloxacin    Makes my mouth sore      Medication List    STOP taking these medications   Butalbital Compound/ASA 50-325-40 MG capsule Generic drug: butalbital-aspirin-caffeine     TAKE these medications   albuterol 108 (90 Base) MCG/ACT inhaler Commonly known as:  VENTOLIN HFA Inhale 2 puffs into the lungs every 6 (six) hours as needed for wheezing or shortness of breath.   amLODipine 5 MG tablet Commonly known as: NORVASC Take 5 mg by mouth daily.   aspirin 81 MG chewable tablet Chew 1 tablet (81 mg total) by mouth 2 (two) times daily.   atorvastatin 40 MG tablet Commonly known as: LIPITOR Take 40 mg by mouth daily.   atropine 1 % ophthalmic solution Place 1 drop into the left eye every Sunday.   betamethasone dipropionate 0.05 % ointment Commonly known as: DIPROLENE Apply 1 application topically daily as needed (psoriasis).   docusate sodium 100 MG capsule Commonly known as: COLACE Take 1 capsule (100 mg total) by mouth 2 (two)  times daily.   famotidine 20 MG tablet Commonly known as: PEPCID Take 20 mg by mouth 2 (two) times daily.   FLUoxetine 40 MG capsule Commonly known as: PROZAC Take 40 mg by mouth daily.   fluticasone 50 MCG/ACT nasal spray Commonly known as: FLONASE Place 2 sprays into both nostrils daily as needed for allergies or rhinitis.   hydrochlorothiazide 25 MG tablet Commonly known as: HYDRODIURIL Take 25 mg by mouth daily.   HYDROcodone-acetaminophen 7.5-325 MG tablet Commonly known as: NORCO Take 1-2 tablets by mouth every 4 (four) hours as needed (pain).   hydrocortisone cream 1 % Apply 1 application topically daily as needed for itching.   ibuprofen 200 MG tablet Commonly known as: ADVIL Take 800 mg by mouth every 6 (six) hours as needed for moderate pain.   losartan 100 MG tablet Commonly known as: COZAAR Take 100 mg by mouth daily.   metoprolol tartrate 25 MG tablet Commonly known as: LOPRESSOR Take 1 tablet (25 mg total) by mouth 2 (two) times daily.   oxymetazoline 0.05 % nasal spray Commonly known as: AFRIN Place 1 spray into both nostrils daily.   PRESERVISION AREDS 2 PO Take 1 capsule by mouth 2 (two) times daily.   tiZANidine 4 MG tablet Commonly known as: ZANAFLEX Take 1 tablet (4 mg total) by mouth at bedtime.   traMADol 50 MG tablet Commonly known as: ULTRAM Take 50 mg by mouth every 6 (six) hours as needed for pain.   Vitamin D 50 MCG (2000 UT) tablet Take 2,000 Units by mouth daily.            Durable Medical Equipment  (From admission, onward)         Start     Ordered   12/04/19 0758  For home use only DME 3 n 1  Once        12/04/19 0758   12/04/19 0758  For home use only DME Walker rolling  Once       Question Answer Comment  Walker: With 5 Inch Wheels   Patient needs a walker to treat with the following condition Osteoarthritis of right hip      12/04/19 0758   12/03/19 1718  DME Walker rolling  Once       Question:  Patient  needs a walker to treat with the following condition  Answer:  History of total hip replacement, right   12/03/19 1717   12/03/19 1718  DME 3 n 1  Once        11 /29/21 1717   12/03/19 1718  DME Bedside commode  Once       Question:  Patient needs a bedside commode to treat with the following condition  Answer:  History of  total hip replacement, right   12/03/19 1717          Diagnostic Studies: DG HIP OPERATIVE UNILAT W OR W/O PELVIS RIGHT  Result Date: 12/03/2019 CLINICAL DATA:  Right hip replacement EXAM: OPERATIVE RIGHT HIP (WITH PELVIS IF PERFORMED) 1 VIEW TECHNIQUE: Fluoroscopic spot image(s) were submitted for interpretation post-operatively. COMPARISON:  None. FINDINGS: Frontal intraoperative view of the right hip demonstrates total hip arthroplasty with a single acetabular screw. No evidence of complication. IMPRESSION: Post right total hip arthroplasty. Electronically Signed   By: Guadlupe Spanish M.D.   On: 12/03/2019 15:47    Disposition: Discharge disposition: 01-Home or Self Care            Signed: Altamese Cabal ,PA-C 12/04/2019, 7:58 AM

## 2019-12-04 NOTE — Discharge Instructions (Signed)
No tight elastic waist bands over the bandage. Not wearing underwear is preferred, or pull waist band above the bandage.  May shower with bandage in place.  If bandage becomes saturated, OK to removal and place band-aid.  You may be up walking around as tolerated but should take periodic breaks to elevate your legs.    Pain medication can cause constipation.  You should increase your fluid intake, increase your intake of high fiber foods and/or take Metamucil as needed for constipation.  You may shower.  You do NOT need to cover the dressing or incision site with plastic wrap.  The dressing or incision can get wet, but do not submerge under water.  After your staples have been removed, you should wait 48 hours before submerging incision under water.  Continue your physical therapy exercises at least twice daily.  It is a good idea to use an ice pack for 30 minutes after doing your exercises to reduce swelling.  Do not be surprised if you have increased pain at night.  This usually means you have been a little too active during the [place] and need to reduce your activities.  If you develop lower extremity swelling that does not improve after a night of elevation, please call the office.  This could be an early sign of a blood clot.  Call with questions, fever>101.5 degrees, shortness of breath or drainage from the wound  [phone]  

## 2019-12-04 NOTE — Progress Notes (Signed)
  Subjective:  Patient reports pain as mild.    Objective:   VITALS:   Vitals:   12/03/19 2220 12/03/19 2331 12/04/19 0206 12/04/19 0512  BP: 138/64 130/61 129/65 126/63  Pulse: 84 72 68 63  Resp: 16 18 18 16   Temp:   98.2 F (36.8 C) 97.9 F (36.6 C)  TempSrc:   Temporal Temporal  SpO2: 96% 96% 96% 96%  Weight:      Height:        PHYSICAL EXAM:  Neurologically intact ABD soft Neurovascular intact Sensation intact distally Intact pulses distally Dorsiflexion/Plantar flexion intact Incision: scant drainage No cellulitis present Compartment soft dressing changed  LABS  Results for orders placed or performed during the hospital encounter of 12/03/19 (from the past 24 hour(s))  I-STAT, chem 8     Status: Abnormal   Collection Time: 12/03/19 10:39 AM  Result Value Ref Range   Sodium 136 135 - 145 mmol/L   Potassium 3.7 3.5 - 5.1 mmol/L   Chloride 98 98 - 111 mmol/L   BUN 17 8 - 23 mg/dL   Creatinine, Ser 12/05/19 0.44 - 1.00 mg/dL   Glucose, Bld 1.74 (H) 70 - 99 mg/dL   Calcium, Ion 081 4.48 - 1.40 mmol/L   TCO2 24 22 - 32 mmol/L   Hemoglobin 11.9 (L) 12.0 - 15.0 g/dL   HCT 1.85 (L) 36 - 46 %  ABO/Rh     Status: None   Collection Time: 12/03/19  5:38 PM  Result Value Ref Range   ABO/RH(D)      O POS Performed at [facility], [address] [place], [place]   CBC     Status: Abnormal   Collection Time: 12/04/19  7:00 AM  Result Value Ref Range   WBC 10.4 4.0 - 10.5 K/uL   RBC 2.73 (L) 3.87 - 5.11 MIL/uL   Hemoglobin 9.7 (L) 12.0 - 15.0 g/dL   HCT 12/06/19 (L) 36 - 46 %   MCV 99.6 80.0 - 100.0 fL   MCH 35.5 (H) 26.0 - 34.0 pg   MCHC 35.7 30.0 - 36.0 g/dL   RDW 63.7 85.8 - 85.0 %   Platelets 185 150 - 400 K/uL   nRBC 0.0 0.0 - 0.2 %    DG HIP OPERATIVE UNILAT W OR W/O PELVIS RIGHT  Result Date: 12/03/2019 CLINICAL DATA:  Right hip replacement EXAM: OPERATIVE RIGHT HIP (WITH PELVIS IF PERFORMED) 1 VIEW TECHNIQUE: Fluoroscopic spot  image(s) were submitted for interpretation post-operatively. COMPARISON:  None. FINDINGS: Frontal intraoperative view of the right hip demonstrates total hip arthroplasty with a single acetabular screw. No evidence of complication. IMPRESSION: Post right total hip arthroplasty. Electronically Signed   By: 12/05/2019 M.D.   On: 12/03/2019 15:47    Assessment/Plan: 1 [place] Post-Op   Active Problems:   History of total hip replacement, right   Advance diet Up with therapy  Discharge home today after PT @ week follow up for staple removal.  Call to confirm appointment.  [phone]   12/05/2019 , MD 12/04/2019, 7:51 AM

## 2019-12-04 NOTE — TOC Progression Note (Signed)
Transition of Care [facility]) - Progression Note    Patient Details  Name: Alisha Shaw MRN: [id number] Date of Birth: [birth year]  [facility] CM/SW Contact  Shawn Route, RN Phone Number: 12/04/2019, 8:52 AM  Clinical Narrative:     DME ordered by surgeon.  Order placed with [employer].  Elease Hashimoto to deliver DME RW and 3 in 1 to bedside.        Expected Discharge Plan and Services           Expected Discharge Date: 12/04/19                                     Social Determinants of Health (SDOH) Interventions    Readmission Risk Interventions No flowsheet data found.

## 2019-12-05 ENCOUNTER — Encounter (INDEPENDENT_AMBULATORY_CARE_PROVIDER_SITE_OTHER): Payer: Medicare Other | Admitting: Ophthalmology

## 2019-12-05 LAB — SURGICAL PATHOLOGY

## 2020-01-15 ENCOUNTER — Encounter: Payer: Self-pay | Admitting: Orthopedic Surgery

## 2020-02-05 ENCOUNTER — Encounter (INDEPENDENT_AMBULATORY_CARE_PROVIDER_SITE_OTHER): Payer: Medicare Other | Admitting: Ophthalmology

## 2020-02-05 ENCOUNTER — Other Ambulatory Visit: Payer: Self-pay

## 2020-02-05 DIAGNOSIS — H353211 Exudative age-related macular degeneration, right eye, with active choroidal neovascularization: Secondary | ICD-10-CM

## 2020-02-05 DIAGNOSIS — H43811 Vitreous degeneration, right eye: Secondary | ICD-10-CM

## 2020-02-05 DIAGNOSIS — H35031 Hypertensive retinopathy, right eye: Secondary | ICD-10-CM

## 2020-02-05 DIAGNOSIS — I10 Essential (primary) hypertension: Secondary | ICD-10-CM | POA: Diagnosis not present

## 2020-02-08 ENCOUNTER — Encounter: Payer: Self-pay | Admitting: Student

## 2020-02-08 ENCOUNTER — Ambulatory Visit: Payer: Medicare Other | Admitting: Student

## 2020-02-08 ENCOUNTER — Other Ambulatory Visit: Payer: Self-pay

## 2020-02-08 VITALS — BP 136/60 | HR 75 | Ht 64.0 in | Wt 180.0 lb

## 2020-02-08 DIAGNOSIS — Z720 Tobacco use: Secondary | ICD-10-CM

## 2020-02-08 DIAGNOSIS — R002 Palpitations: Secondary | ICD-10-CM | POA: Diagnosis not present

## 2020-02-08 DIAGNOSIS — I10 Essential (primary) hypertension: Secondary | ICD-10-CM

## 2020-02-08 DIAGNOSIS — E782 Mixed hyperlipidemia: Secondary | ICD-10-CM

## 2020-02-08 MED ORDER — METOPROLOL TARTRATE 25 MG PO TABS: 25.0000 mg | ORAL_TABLET | Freq: Two times a day (BID) | ORAL | 3 refills | Status: DC

## 2020-02-08 NOTE — Progress Notes (Signed)
Cardiology Office Note    Date:  02/08/2020   ID:  Alisha Shaw, DOB [birth year], MRN [id number]  PCP:  Leanna Sato, MD  Cardiologist: Nona Dell, MD    Chief Complaint  Patient presents with  . Follow-up    6 month visit    History of Present Illness:    Alisha Shaw is a 70 y.o. female with past medical history of HTN, HLD, COPD and palpitations who presents to the office today for 76-month follow-up.  She was last examined myself in 09/2019 and reported still having palpitations which would occur a few days each week and last for seconds to minutes at a time. Lopressor had recently been titrated 25 mg twice daily which helped with her symptoms and she was continued on this dose and informed she could take an extra 12.5 mg if needed for persistent symptoms.  In talking with the patient today, she reports overall doing well since her last office visit.  Says that her palpitations have significantly improved with titration of Lopressor. She denies any recent chest pain. She did quit smoking and switch to vaping over 4 months ago and says that her breathing has improved with this. No recent orthopnea, PND or lower extremity edema. She did undergo hip replacement in 11/2019 and reports initially recovering well from the surgery but has developed lumbar radiculopathy since and is currently working with PT.   Past Medical History:  Diagnosis Date  . Anxiety   . COPD (chronic obstructive pulmonary disease) (HCC)   . Depression   . Family history of adverse reaction to anesthesia    sister difficult to wake up  . GERD (gastroesophageal reflux disease)    indigestion at times not related to food  . Headache(784.0)    history of migranes  . History of kidney stones    passed without issue  . Hypertension   . Palpitations with regular cardiac rhythm   . PONV (postoperative nausea and vomiting)    1978 after gallbladder surgery    Past Surgical History:  Procedure Laterality  Date  . ABDOMINAL HYSTERECTOMY  1995  . APPENDECTOMY     removed when had gallbladder surgery  . CATARACT EXTRACTION W/PHACO Right 08/27/2019   Procedure: CATARACT EXTRACTION PHACO AND INTRAOCULAR LENS PLACEMENT (I[facility]);  Surgeon: [name], MD;  Location: [facility];  Service: Ophthalmology;  Laterality: Right;  CDE: 6.90  . CHOLECYSTECTOMY  1976   [place]  . EYE SURGERY Left 2012   Mag[place] deg.  Marland Kitchen HEMORRHOID SURGERY N/A 07/17/2012   Procedure: HEMORRHOIDECTOMY;  Surgeon: Dalia Heading, MD;  Location: [facility];  Service: General;  Laterality: N/A;  . spider bite Left 2011   brown recluse spider bite  . TOTAL HIP ARTHROPLASTY Right 12/03/2019   Procedure: TOTAL HIP ARTHROPLASTY ANTERIOR APPROACH;  Surgeon: Lyndle Herrlich, MD;  Location: [facility];  Service: Orthopedics;  Laterality: Right;    Current Medications: Outpatient Medications Prior to Visit  Medication Sig Dispense Refill  . albuterol (VENTOLIN HFA) 108 (90 Base) MCG/ACT inhaler Inhale 2 puffs into the lungs every 6 (six) hours as needed for wheezing or shortness of breath.    Marland Kitchen amLODipine (NORVASC) 5 MG tablet Take 5 mg by mouth daily.    Marland Kitchen aspirin 81 MG chewable tablet Chew 1 tablet (81 mg total) by mouth 2 (two) times daily. 60 tablet 0  . atorvastatin (LIPITOR) 40 MG tablet Take 40 mg by mouth daily.    Marland Kitchen  atropine 1 % ophthalmic solution Place 1 drop into the left eye every Sunday.     . betamethasone dipropionate (DIPROLENE) 0.05 % ointment Apply 1 application topically daily as needed (psoriasis).     . Cholecalciferol (VITAMIN D) 50 MCG (2000 UT) tablet Take 2,000 Units by mouth daily.     Marland Kitchen docusate sodium (COLACE) 100 MG capsule Take 1 capsule (100 mg total) by mouth 2 (two) times daily. 30 capsule 0  . famotidine (PEPCID) 20 MG tablet Take 20 mg by mouth 2 (two) times daily.     Marland Kitchen FLUoxetine (PROZAC) 40 MG capsule Take 40 mg by mouth daily.    Marland Kitchen gabapentin (NEURONTIN) 300 MG capsule gabapentin 300 mg capsule  TAKE 1  CAPSULE BY MOUTH ONCE DAILY AT BEDTIME FOR 30 DAYS    . hydrochlorothiazide (HYDRODIURIL) 25 MG tablet Take 25 mg by mouth daily.    Marland Kitchen HYDROcodone-acetaminophen (NORCO) 7.5-325 MG tablet Take 1-2 tablets by mouth every 4 (four) hours as needed (pain). 50 tablet 0  . hydrocortisone cream 1 % Apply 1 application topically daily as needed for itching.    Marland Kitchen ibuprofen (ADVIL) 200 MG tablet Take 800 mg by mouth every 6 (six) hours as needed for moderate pain.    Marland Kitchen losartan (COZAAR) 100 MG tablet Take 100 mg by mouth daily.     . methocarbamol (ROBAXIN) 500 MG tablet Take 500 mg by mouth in the morning and at bedtime.    . Multiple Vitamins-Minerals (PRESERVISION AREDS 2 PO) Take 1 capsule by mouth 2 (two) times daily.     Marland Kitchen oxymetazoline (AFRIN) 0.05 % nasal spray Place 1 spray into both nostrils daily.    . traMADol (ULTRAM) 50 MG tablet Take 50 mg by mouth every 6 (six) hours as needed for pain.    . metoprolol tartrate (LOPRESSOR) 25 MG tablet Take 1 tablet (25 mg total) by mouth 2 (two) times daily. 180 tablet 3  . metoprolol tartrate (LOPRESSOR) 25 MG tablet Take 25 mg by mouth 2 (two) times daily.    Marland Kitchen tiZANidine (ZANAFLEX) 4 MG tablet Take 1 tablet (4 mg total) by mouth at bedtime. 30 tablet 1  . fluticasone (FLONASE) 50 MCG/ACT nasal spray Place 2 sprays into both nostrils daily as needed for allergies or rhinitis. (Patient not taking: Reported on 11/23/2019)     No facility-administered medications prior to visit.     Allergies:   Ciprofloxacin   Social History   Socioeconomic History  . Marital status: Married    Spouse name: Gery Pray  . Number of children: Not on file  . Years of education: Not on file  . Highest education level: Not on file  Occupational History  . Not on file  Tobacco Use  . Smoking status: Former Smoker    Packs/Bougher: 0.25    Years: 50.00    Pack years: 12.50    Types: Cigarettes    Quit date: 11/08/2019    Years since quitting: 0.2  . Smokeless tobacco:  Never Used  . Tobacco comment: only smoking 2 -3 cigs [place]  Vaping Use  . Vaping Use: Every Saber  Substance and Sexual Activity  . Alcohol use: Not Currently    Comment: socially  . Drug use: No  . Sexual activity: Never  Other Topics Concern  . Not on file  Social History Narrative  . Not on file   Social Determinants of Health   Financial Resource Strain: Not on file  Food Insecurity: Not  on file  Transportation Needs: Not on file  Physical Activity: Not on file  Stress: Not on file  Social Connections: Not on file     Family History:  The patient's family history includes CAD in her brother; CVA in her brother; Parkinson's disease in her father and sister.   Review of Systems:   Please see the history of present illness.     General:  No chills, fever, night sweats or weight changes. Positive for back pain.  Cardiovascular:  No chest pain, dyspnea on exertion, edema, orthopnea, palpitations, paroxysmal nocturnal dyspnea. Dermatological: No rash, lesions/masses Respiratory: No cough, dyspnea Urologic: No hematuria, dysuria Abdominal:   No nausea, vomiting, diarrhea, bright red blood per rectum, melena, or hematemesis Neurologic:  No visual changes, wkns, changes in mental status. All other systems reviewed and are otherwise negative except as noted above.   Physical Exam:    VS:  BP 136/60   Pulse 75   Ht 5\' 4"  (1.626 m)   Wt 180 lb (81.6 kg)   SpO2 95%   BMI 30.90 kg/m    General: Well developed, well nourished,female appearing in no acute distress. Head: Normocephalic, atraumatic. Neck: No carotid bruits. JVD not elevated.  Lungs: Respirations regular and unlabored, without wheezes or rales.  Heart: Regular rate and rhythm. No S3 or S4.  No murmur, no rubs, or gallops appreciated. Abdomen: Appears non-distended. No obvious abdominal masses. Msk:  Strength and tone appear normal for age. No obvious joint deformities or effusions. Extremities: No clubbing or  cyanosis. No lower extremity edema.  Distal pedal pulses are 2+ bilaterally. Neuro: Alert and oriented X 3. Moves all extremities spontaneously. No focal deficits noted. Psych:  Responds to questions appropriately with a normal affect. Skin: No rashes or lesions noted  Wt Readings from Last 3 Encounters:  02/08/20 180 lb (81.6 kg)  12/03/19 176 lb (79.8 kg)  11/28/19 176 lb 5.9 oz (80 kg)     Studies/Labs Reviewed:   EKG:  EKG is not ordered today.   Recent Labs: 12/04/2019: BUN 15; Creatinine, Ser 0.71; Hemoglobin 9.7; Platelets 185; Potassium 3.5; Sodium 132   Lipid Panel No results found for: CHOL, TRIG, HDL, CHOLHDL, VLDL, LDLCALC, LDLDIRECT  Additional studies/ records that were reviewed today include:   Event Monitor: 04/2019  Sinus rhythm, sinus arrhythmia, sinus tachycardia, and frequent PACs. Symptoms primarily correlated with PACs. There were brief atrial runs. There were no sustained arrhythmias.  NST: 04/2019  There was no ST segment deviation noted during stress.  The study is normal. There are no perfusion defects.  This is a low risk study.  The left ventricular ejection fraction is normal (55-65%).   Echocardiogram: 04/2019 IMPRESSIONS    1. Left ventricular ejection fraction, by estimation, is 65 to 70%. The  left ventricle has normal function. The left ventricle has no regional  wall motion abnormalities. Left ventricular diastolic parameters are  consistent with Grade I diastolic  dysfunction (impaired relaxation).  2. Right ventricular systolic function is normal. The right ventricular  size is normal.  3. The mitral valve is normal in structure. No evidence of mitral valve  regurgitation. No evidence of mitral stenosis.  4. The aortic valve is tricuspid. Aortic valve regurgitation is not  visualized. No aortic stenosis is present.  5. The inferior vena cava is normal in size with greater than 50%  respiratory variability, suggesting  right atrial pressure of 3 mmHg.   Assessment:    1. Palpitations  2. Essential hypertension   3. Mixed hyperlipidemia   4. Tobacco abuse      Plan:   In order of problems listed above:  1. Palpitations - She reports her symptoms have significantly improved and she only has occasional symptoms at night. Will continue with Lopressor 25 mg twice daily and she is aware she can take an extra half tablet if needed for persistent palpitations.  2. HTN - BP is well controlled at 136/60 during today's visit. Continue current medication regimen with Amlodipine 5 mg daily, HCTZ 25 mg daily, Losartan 100 mg daily and Lopressor 25 mg twice daily.  3. HLD - Followed by her PCP. Will request a copy of most recent labs. She remains on Atorvastatin 40 mg daily.  4. Tobacco Use - While she does vape, she is gradually reducing her nicotine use and was able to give up cigarettes after 50+ years.  She was congratulated on her reduction with continued cessation advised.   Medication Adjustments/Labs and Tests Ordered: Current medicines are reviewed at length with the patient today.  Concerns regarding medicines are outlined above.  Medication changes, Labs and Tests ordered today are listed in the Patient Instructions below. Patient Instructions  Medication Instructions:  Your physician recommends that you continue on your current medications as directed. Please refer to the Current Medication list given to you today.  *If you need a refill on your cardiac medications before your next appointment, please call your pharmacy*   Lab Work: NONE   If you have labs (blood work) drawn today and your tests are completely normal, you will receive your results only by: Marland Kitchen MyChart Message (if you have MyChart) OR . A paper copy in the mail If you have any lab test that is abnormal or we need to change your treatment, we will call you to review the results.   Testing/Procedures: NONE     Follow-Up: At [facility], you and your health needs are our priority.  As part of our continuing mission to provide you with exceptional heart care, we have created designated Provider Care Teams.  These Care Teams include your primary Cardiologist (physician) and Advanced Practice Providers (APPs -  Physician Assistants and Nurse Practitioners) who all work together to provide you with the care you need, when you need it.  We recommend signing up for the patient portal called "MyChart".  Sign up information is provided on this After Visit Summary.  MyChart is used to connect with patients for Virtual Visits (Telemedicine).  Patients are able to view lab/test results, encounter notes, upcoming appointments, etc.  Non-urgent messages can be sent to your provider as well.   To learn more about what you can do with MyChart, go to [email or web address].    Your next appointment:   1 year(s)  The format for your next appointment:   In Person  Provider:   Nona Dell, MD or Randall An, PA-C   Other Instructions Thank you for choosing [facility]!    Signed, Ellsworth Lennox, PA-C  02/08/2020 4:32 PM    [facility] [address] [place], [place] [address] Phone: [phone] Fax: [phone]

## 2020-02-08 NOTE — Patient Instructions (Signed)
Medication Instructions:  Your physician recommends that you continue on your current medications as directed. Please refer to the Current Medication list given to you today.  *If you need a refill on your cardiac medications before your next appointment, please call your pharmacy*   Lab Work: NONE   If you have labs (blood work) drawn today and your tests are completely normal, you will receive your results only by: MyChart Message (if you have MyChart) OR A paper copy in the mail If you have any lab test that is abnormal or we need to change your treatment, we will call you to review the results.   Testing/Procedures: NONE    Follow-Up: At [facility], you and your health needs are our priority.  As part of our continuing mission to provide you with exceptional heart care, we have created designated Provider Care Teams.  These Care Teams include your primary Cardiologist (physician) and Advanced Practice Providers (APPs -  Physician Assistants and Nurse Practitioners) who all work together to provide you with the care you need, when you need it.  We recommend signing up for the patient portal called "MyChart".  Sign up information is provided on this After Visit Summary.  MyChart is used to connect with patients for Virtual Visits (Telemedicine).  Patients are able to view lab/test results, encounter notes, upcoming appointments, etc.  Non-urgent messages can be sent to your provider as well.   To learn more about what you can do with MyChart, go to [email or web address].    Your next appointment:   1 year(s)  The format for your next appointment:   In Person  Provider:   Samuel McDowell, MD or Brittany Strader, PA-C   Other Instructions Thank you for choosing [facility]!    

## 2020-06-11 ENCOUNTER — Encounter (INDEPENDENT_AMBULATORY_CARE_PROVIDER_SITE_OTHER): Payer: Medicare Other | Admitting: Ophthalmology

## 2020-06-11 ENCOUNTER — Other Ambulatory Visit: Payer: Self-pay

## 2020-06-11 DIAGNOSIS — H353211 Exudative age-related macular degeneration, right eye, with active choroidal neovascularization: Secondary | ICD-10-CM

## 2020-06-11 DIAGNOSIS — H35031 Hypertensive retinopathy, right eye: Secondary | ICD-10-CM

## 2020-06-11 DIAGNOSIS — I10 Essential (primary) hypertension: Secondary | ICD-10-CM | POA: Diagnosis not present

## 2020-06-11 DIAGNOSIS — H43811 Vitreous degeneration, right eye: Secondary | ICD-10-CM

## 2020-08-07 ENCOUNTER — Other Ambulatory Visit: Payer: Self-pay | Admitting: Cardiology

## 2020-10-06 ENCOUNTER — Telehealth: Payer: Self-pay | Admitting: Student

## 2020-10-06 MED ORDER — METOPROLOL TARTRATE 25 MG PO TABS
ORAL_TABLET | ORAL | 2 refills | Status: DC
Start: 2020-10-06 — End: 2021-06-29

## 2020-10-06 NOTE — Telephone Encounter (Signed)
1. Palpitations from OV on 02/08/20 by [place], PA-C  - She reports her symptoms have significantly improved and she only has occasional symptoms at night. Will continue with Lopressor 25 mg twice daily and she is aware she can take an extra half tablet if needed for persistent palpitations        Refilled rx to [facility]

## 2020-10-06 NOTE — Telephone Encounter (Signed)
New message   Needs new prescription for 1.5 tablet  2x a [place]   *STAT* If patient is at the pharmacy, call can be transferred to refill team.   1. Which medications need to be refilled? (please list name of each medication and dose if known) metoprolol tartrate (LOPRESSOR) 25 MG tablet  2. Which pharmacy/location (including street and city if local pharmacy) is medication to be sent to?[facility]   3. Do they need a [address] or [address] supply? 90

## 2020-12-10 ENCOUNTER — Other Ambulatory Visit: Payer: Self-pay

## 2020-12-10 ENCOUNTER — Encounter (INDEPENDENT_AMBULATORY_CARE_PROVIDER_SITE_OTHER): Payer: Medicare Other | Admitting: Ophthalmology

## 2020-12-10 DIAGNOSIS — H353211 Exudative age-related macular degeneration, right eye, with active choroidal neovascularization: Secondary | ICD-10-CM

## 2020-12-10 DIAGNOSIS — H43811 Vitreous degeneration, right eye: Secondary | ICD-10-CM | POA: Diagnosis not present

## 2020-12-10 DIAGNOSIS — H35031 Hypertensive retinopathy, right eye: Secondary | ICD-10-CM | POA: Diagnosis not present

## 2020-12-10 DIAGNOSIS — I10 Essential (primary) hypertension: Secondary | ICD-10-CM

## 2021-06-10 ENCOUNTER — Encounter (INDEPENDENT_AMBULATORY_CARE_PROVIDER_SITE_OTHER): Payer: Medicare Other | Admitting: Ophthalmology

## 2021-06-10 DIAGNOSIS — H353211 Exudative age-related macular degeneration, right eye, with active choroidal neovascularization: Secondary | ICD-10-CM | POA: Diagnosis not present

## 2021-06-10 DIAGNOSIS — H43811 Vitreous degeneration, right eye: Secondary | ICD-10-CM | POA: Diagnosis not present

## 2021-06-10 DIAGNOSIS — I10 Essential (primary) hypertension: Secondary | ICD-10-CM | POA: Diagnosis not present

## 2021-06-10 DIAGNOSIS — H35031 Hypertensive retinopathy, right eye: Secondary | ICD-10-CM | POA: Diagnosis not present

## 2021-06-10 DIAGNOSIS — H2512 Age-related nuclear cataract, left eye: Secondary | ICD-10-CM

## 2021-06-27 ENCOUNTER — Other Ambulatory Visit: Payer: Self-pay | Admitting: Student

## 2021-09-15 IMAGING — RF DG HIP (WITH PELVIS) OPERATIVE*R*
1 series · 1 of 1 positions shown · non-contrast
Comparison: None.

CLINICAL DATA: Right hip replacement

EXAM:
OPERATIVE RIGHT HIP (WITH PELVIS IF PERFORMED) 1 VIEW
TECHNIQUE: Fluoroscopic spot image(s) were submitted for interpretation
post-operatively.

[Series 1: dg x-ray · 0.20mm/px · 1 of 1 slices shown]
[im 1/1]
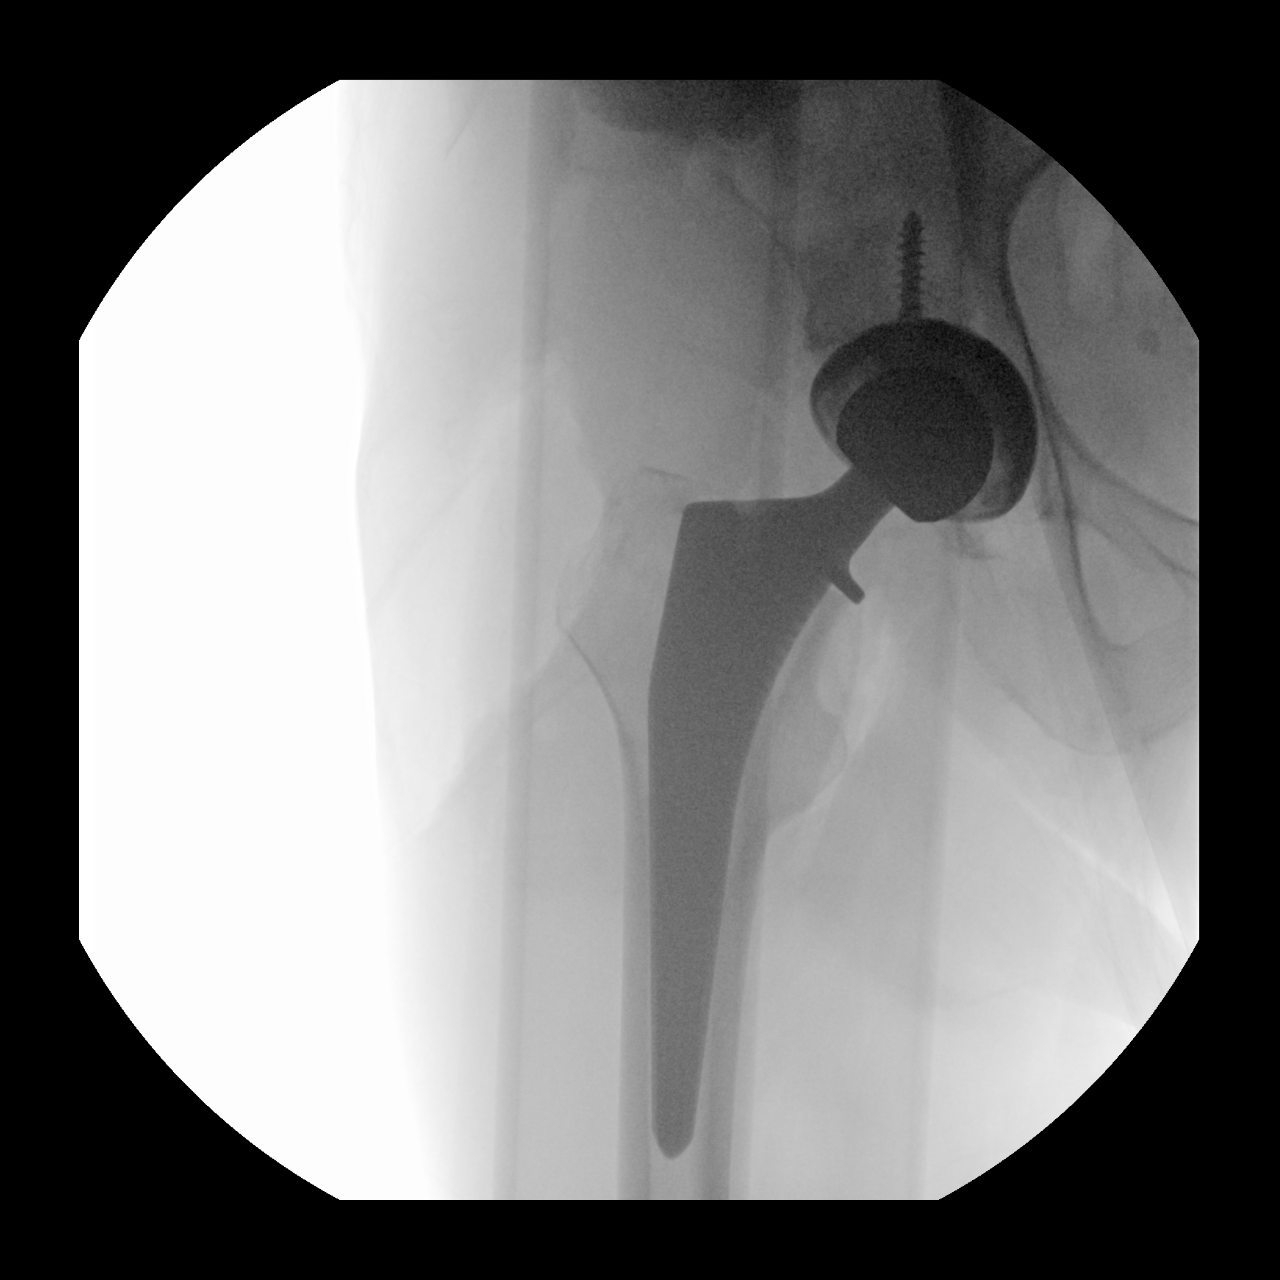

[1 of 1 positions shown; findings below may reference images not displayed]

FINDINGS: Frontal intraoperative view of the right hip demonstrates total hip
arthroplasty with a single acetabular screw. No evidence of
complication.
IMPRESSION: Post right total hip arthroplasty.

## 2021-12-01 ENCOUNTER — Encounter: Payer: Self-pay | Admitting: Cardiology

## 2021-12-01 NOTE — Progress Notes (Unsigned)
Cardiology Office Note  Date: 12/02/2021   ID: Alisha Shaw, DOB [birth year], MRN [id number]  PCP:  Marguerita Merles, MD  Cardiologist:  Rozann Lesches, MD Electrophysiologist:  None   Chief Complaint  Patient presents with   Cardiac follow-up    History of Present Illness: Alisha Shaw is a 71 y.o. female former patient of Dr. Bronson Ing now presenting to establish follow-up with me.  She was last seen in February 2022 by Ms. Strader PA-C.  I reviewed her records and updated the chart.  She presents for follow-up of palpitations, overall doing very well on Lopressor.  In general she takes Lopressor 25 mg twice daily with an additional 12.5 mg tablet in the evening.  She does not report any exertional chest pain, no unexplained syncope.  I reviewed her prior cardiac testing from 2021 as noted below.  I personally reviewed her ECG today which shows normal sinus rhythm.  Past Medical History:  Diagnosis Date   Anxiety    COPD (chronic obstructive pulmonary disease) (HCC)    Depression    GERD (gastroesophageal reflux disease)    History of kidney stones    Hypertension    Migraine    Palpitations     Past Surgical History:  Procedure Laterality Date   ABDOMINAL HYSTERECTOMY  1995   APPENDECTOMY     removed when had gallbladder surgery   CATARACT EXTRACTION W/PHACO Right 08/27/2019   Procedure: CATARACT EXTRACTION PHACO AND INTRAOCULAR LENS PLACEMENT ([place]);  Surgeon: Baruch Goldmann, MD;  Location: [facility];  Service: Ophthalmology;  Laterality: Right;  CDE: 6.90   CHOLECYSTECTOMY  1976   [place]   EYE SURGERY Left 2012   [place] deg.   HEMORRHOID SURGERY N/A 07/17/2012   Procedure: HEMORRHOIDECTOMY;  Surgeon: Jamesetta So, MD;  Location: [facility];  Service: General;  Laterality: N/A;   spider bite Left 2011   brown recluse spider bite   TOTAL HIP ARTHROPLASTY Right 12/03/2019   Procedure: TOTAL HIP ARTHROPLASTY ANTERIOR APPROACH;  Surgeon: Lovell Sheehan, MD;  Location:  [facility];  Service: Orthopedics;  Laterality: Right;    Current Outpatient Medications  Medication Sig Dispense Refill   albuterol (VENTOLIN HFA) 108 (90 Base) MCG/ACT inhaler Inhale 2 puffs into the lungs every 6 (six) hours as needed for wheezing or shortness of breath.     amLODipine (NORVASC) 5 MG tablet Take 5 mg by mouth daily.     aspirin 81 MG chewable tablet Chew 1 tablet (81 mg total) by mouth 2 (two) times daily. 60 tablet 0   atorvastatin (LIPITOR) 40 MG tablet Take 40 mg by mouth daily.     atropine 1 % ophthalmic solution Place 1 drop into the left eye every Sunday.      betamethasone dipropionate (DIPROLENE) 0.05 % ointment Apply 1 application topically daily as needed (psoriasis).      Cholecalciferol (VITAMIN D) 50 MCG (2000 UT) tablet Take 2,000 Units by mouth daily.      docusate sodium (COLACE) 100 MG capsule Take 1 capsule (100 mg total) by mouth 2 (two) times daily. 30 capsule 0   famotidine (PEPCID) 20 MG tablet Take 20 mg by mouth 2 (two) times daily.      FLUoxetine (PROZAC) 40 MG capsule Take 40 mg by mouth daily.     gabapentin (NEURONTIN) 300 MG capsule gabapentin 300 mg capsule  TAKE 1 CAPSULE BY MOUTH ONCE DAILY AT BEDTIME FOR 30 DAYS     hydrochlorothiazide (HYDRODIURIL)  25 MG tablet Take 25 mg by mouth daily.     HYDROcodone-acetaminophen (NORCO) 7.5-325 MG tablet Take 1-2 tablets by mouth every 4 (four) hours as needed (pain). 50 tablet 0   hydrocortisone cream 1 % Apply 1 application topically daily as needed for itching.     ibuprofen (ADVIL) 200 MG tablet Take 800 mg by mouth every 6 (six) hours as needed for moderate pain.     losartan (COZAAR) 100 MG tablet Take 100 mg by mouth daily.      methocarbamol (ROBAXIN) 500 MG tablet Take 500 mg by mouth in the morning and at bedtime.     metoprolol tartrate (LOPRESSOR) 25 MG tablet TAKE 1 TABLET BY MOUTH TWICE DAILY -  MAY  TAKE  AN  EXTRA  ONE-HALF  TABLET  (12.5 MG) ONCE  A  [place]  FOR  PALPITATIONS 225 tablet 1    Multiple Vitamins-Minerals (PRESERVISION AREDS 2 PO) Take 1 capsule by mouth 2 (two) times daily.      oxymetazoline (AFRIN) 0.05 % nasal spray Place 1 spray into both nostrils daily.     traMADol (ULTRAM) 50 MG tablet Take 50 mg by mouth every 6 (six) hours as needed for pain.     No current facility-administered medications for this visit.   Allergies:  Ciprofloxacin   ROS: Chronic back pain.  Physical Exam: VS:  BP (!) 140/90   Pulse 80   Ht 5\' 4"  (1.626 m)   Wt 182 lb (82.6 kg)   SpO2 96%   BMI 31.24 kg/m , BMI Body mass index is 31.24 kg/m.  Wt Readings from Last 3 Encounters:  12/02/21 182 lb (82.6 kg)  02/08/20 180 lb (81.6 kg)  12/03/19 176 lb (79.8 kg)    General: Patient appears comfortable at rest. HEENT: Conjunctiva and lids normal. Neck: Supple, no elevated JVP or carotid bruits. Lungs: Clear to auscultation, nonlabored breathing at rest. Cardiac: Regular rate and rhythm, no S3 or significant systolic murmur.  ECG:  An ECG dated 11/28/2019 was personally reviewed today and demonstrated:  Sinus rhythm with nonspecific ST changes.  Recent Labwork:  October 2023: Hemoglobin 11.6, platelets 259, BUN 20, creatinine 0.74, potassium 3.5, AST 27, ALT 31, cholesterol 158, triglycerides 263, HDL 42, LDL 73, hemoglobin A1c 6.9%, TSH 0.94  Other Studies Reviewed Today:  Lexiscan Myoview 05/03/2019: There was no ST segment deviation noted during stress. The study is normal. There are no perfusion defects. This is a low risk study. The left ventricular ejection fraction is normal (55-65%).  Echocardiogram 05/03/2019: 1. Left ventricular ejection fraction, by estimation, is 65 to 70%. The  left ventricle has normal function. The left ventricle has no regional  wall motion abnormalities. Left ventricular diastolic parameters are  consistent with Grade I diastolic  dysfunction (impaired relaxation).   2. Right ventricular systolic function is normal. The right  ventricular  size is normal.   3. The mitral valve is normal in structure. No evidence of mitral valve  regurgitation. No evidence of mitral stenosis.   4. The aortic valve is tricuspid. Aortic valve regurgitation is not  visualized. No aortic stenosis is present.   5. The inferior vena cava is normal in size with greater than 50%  respiratory variability, suggesting right atrial pressure of 3 mmHg.   Assessment and Plan:  1.  Palpitations, well controlled on low-dose beta-blocker.  Previous cardiac monitoring in 2021 showed frequent PACs but no sustained arrhythmias.  ECG is normal today.  Continue with  current treatment plan.  2.  Essential hypertension, blood pressure is mildly elevated today.  She is on Norvasc and losartan.  Keep follow-up with PCP in case further adjustments are necessary.  3.  Mixed hyperlipidemia on Lipitor.  Recent LDL in October was 73.  No changes were made.  Medication Adjustments/Labs and Tests Ordered: Current medicines are reviewed at length with the patient today.  Concerns regarding medicines are outlined above.   Tests Ordered: Orders Placed This Encounter  Procedures   EKG 12-Lead    Medication Changes: No orders of the defined types were placed in this encounter.   Disposition:  Follow up  1 year.  Signed, Satira Sark, MD, [facility] 12/02/2021 9:35 AM    [facility] at [facility] [address], [place], [place] [address] Phone: [phone]; Fax: [phone]

## 2021-12-02 ENCOUNTER — Ambulatory Visit: Payer: Medicare Other | Attending: Cardiology | Admitting: Cardiology

## 2021-12-02 ENCOUNTER — Encounter: Payer: Self-pay | Admitting: Cardiology

## 2021-12-02 VITALS — BP 140/90 | HR 80 | Ht 64.0 in | Wt 182.0 lb

## 2021-12-02 DIAGNOSIS — R002 Palpitations: Secondary | ICD-10-CM

## 2021-12-02 DIAGNOSIS — I10 Essential (primary) hypertension: Secondary | ICD-10-CM

## 2021-12-02 DIAGNOSIS — E782 Mixed hyperlipidemia: Secondary | ICD-10-CM | POA: Diagnosis not present

## 2021-12-02 DIAGNOSIS — I491 Atrial premature depolarization: Secondary | ICD-10-CM | POA: Diagnosis not present

## 2021-12-02 NOTE — Patient Instructions (Signed)
Medication Instructions:  Your physician recommends that you continue on your current medications as directed. Please refer to the Current Medication list given to you today.   Labwork: None today  Testing/Procedures: None today  Follow-Up: 1 year  Any Other Special Instructions Will Be Listed Below (If Applicable).  If you need a refill on your cardiac medications before your next appointment, please call your pharmacy.  

## 2021-12-09 ENCOUNTER — Encounter (INDEPENDENT_AMBULATORY_CARE_PROVIDER_SITE_OTHER): Payer: Medicare Other | Admitting: Ophthalmology

## 2021-12-09 DIAGNOSIS — H43811 Vitreous degeneration, right eye: Secondary | ICD-10-CM

## 2021-12-09 DIAGNOSIS — H353211 Exudative age-related macular degeneration, right eye, with active choroidal neovascularization: Secondary | ICD-10-CM

## 2021-12-09 DIAGNOSIS — I10 Essential (primary) hypertension: Secondary | ICD-10-CM

## 2021-12-09 DIAGNOSIS — H35031 Hypertensive retinopathy, right eye: Secondary | ICD-10-CM | POA: Diagnosis not present

## 2021-12-29 ENCOUNTER — Other Ambulatory Visit: Payer: Self-pay | Admitting: Cardiology

## 2022-06-16 ENCOUNTER — Encounter (INDEPENDENT_AMBULATORY_CARE_PROVIDER_SITE_OTHER): Payer: Medicare Other | Admitting: Ophthalmology

## 2022-06-16 DIAGNOSIS — H353211 Exudative age-related macular degeneration, right eye, with active choroidal neovascularization: Secondary | ICD-10-CM | POA: Diagnosis not present

## 2022-06-16 DIAGNOSIS — I10 Essential (primary) hypertension: Secondary | ICD-10-CM

## 2022-06-16 DIAGNOSIS — H43811 Vitreous degeneration, right eye: Secondary | ICD-10-CM

## 2022-06-16 DIAGNOSIS — H35031 Hypertensive retinopathy, right eye: Secondary | ICD-10-CM

## 2022-09-27 ENCOUNTER — Other Ambulatory Visit: Payer: Self-pay | Admitting: Cardiology

## 2022-12-15 ENCOUNTER — Encounter (INDEPENDENT_AMBULATORY_CARE_PROVIDER_SITE_OTHER): Payer: Medicare Other | Admitting: Ophthalmology

## 2022-12-15 DIAGNOSIS — H353211 Exudative age-related macular degeneration, right eye, with active choroidal neovascularization: Secondary | ICD-10-CM | POA: Diagnosis not present

## 2022-12-15 DIAGNOSIS — I10 Essential (primary) hypertension: Secondary | ICD-10-CM | POA: Diagnosis not present

## 2022-12-15 DIAGNOSIS — H43811 Vitreous degeneration, right eye: Secondary | ICD-10-CM

## 2022-12-15 DIAGNOSIS — H35031 Hypertensive retinopathy, right eye: Secondary | ICD-10-CM

## 2022-12-30 ENCOUNTER — Other Ambulatory Visit: Payer: Self-pay | Admitting: Cardiology

## 2023-02-02 ENCOUNTER — Other Ambulatory Visit: Payer: Self-pay | Admitting: Cardiology

## 2023-02-15 ENCOUNTER — Telehealth: Payer: Self-pay | Admitting: Cardiology

## 2023-02-15 MED ORDER — METOPROLOL TARTRATE 25 MG PO TABS: ORAL_TABLET | ORAL | 1 refills | Status: DC

## 2023-02-15 NOTE — Telephone Encounter (Signed)
*  STAT* If patient is at the pharmacy, call can be transferred to refill team.   1. Which medications need to be refilled? (please list name of each medication and dose if known) metoprolol tartrate (LOPRESSOR) 25 MG tablet   2. Which pharmacy/location (including street and city if local pharmacy) is medication to be sent to? [id number] - [place], [place] - [address]   3. Do they need a [address] or [address] supply? 90

## 2023-02-15 NOTE — Telephone Encounter (Signed)
Refill completed and sent to [facility] per pt's request.

## 2023-04-07 ENCOUNTER — Other Ambulatory Visit: Payer: Self-pay | Admitting: Cardiology

## 2023-04-19 ENCOUNTER — Encounter: Payer: Self-pay | Admitting: Cardiology

## 2023-04-19 ENCOUNTER — Ambulatory Visit: Payer: Medicare Other | Attending: Cardiology | Admitting: Cardiology

## 2023-04-19 VITALS — BP 130/62 | HR 66 | Ht 64.0 in | Wt 176.6 lb

## 2023-04-19 DIAGNOSIS — R002 Palpitations: Secondary | ICD-10-CM | POA: Diagnosis not present

## 2023-04-19 DIAGNOSIS — E782 Mixed hyperlipidemia: Secondary | ICD-10-CM

## 2023-04-19 DIAGNOSIS — I10 Essential (primary) hypertension: Secondary | ICD-10-CM

## 2023-04-19 DIAGNOSIS — I491 Atrial premature depolarization: Secondary | ICD-10-CM

## 2023-04-19 MED ORDER — METOPROLOL TARTRATE 25 MG PO TABS: ORAL_TABLET | ORAL | 3 refills | Status: AC

## 2023-04-19 NOTE — Patient Instructions (Signed)
 Medication Instructions:  Your physician recommends that you continue on your current medications as directed. Please refer to the Current Medication list given to you today.   Labwork: None today  Testing/Procedures: None today  Follow-Up: 1 year  Any Other Special Instructions Will Be Listed Below (If Applicable).  If you need a refill on your cardiac medications before your next appointment, please call your pharmacy.

## 2023-04-19 NOTE — Progress Notes (Signed)
    Cardiology Office Note  Date: 04/19/2023   ID: Vanessia Bokhari Shake, DOB 1950/12/16, MRN [id number]  History of Present Illness: Alisha Shaw is a 73 y.o. female last seen in November 2023.  She is here for a routine visit.  Reports good control of palpitations on current dose of Lopressor.  She does not indicate any trouble with exertional chest pain, no unusual shortness of breath, no syncope.  We went over her medications.  She continues to follow regularly with PCP.  I reviewed her interval lab work from October 2024.  I reviewed her ECG today which shows normal sinus rhythm with nonspecific ST changes.  Physical Exam: VS:  BP 130/62   Pulse 66   Ht 5\' 4"  (1.626 m)   Wt 176 lb 9.6 oz (80.1 kg)   SpO2 (!) 5%   BMI 30.31 kg/m , BMI Body mass index is 30.31 kg/m.  Wt Readings from Last 3 Encounters:  04/19/23 176 lb 9.6 oz (80.1 kg)  12/02/21 182 lb (82.6 kg)  02/08/20 180 lb (81.6 kg)    General: Patient appears comfortable at rest. HEENT: Conjunctiva and lids normal. Neck: Supple, no elevated JVP or carotid bruits. Lungs: Clear to auscultation, nonlabored breathing at rest. Cardiac: Regular rate and rhythm, no S3 or significant systolic murmur. Extremities: No pitting edema.  ECG:  An ECG dated 12/02/2021 was personally reviewed today and demonstrated:  Sinus rhythm.  Labwork:  October 2024: Hemoglobin 11.3, platelets 222, BUN 13, creatinine 0.77, potassium 4.4, AST 55, ALT 52, cholesterol 171, triglycerides 190, HDL 47, LDL 92, TSH 1.61  Other Studies Reviewed Today:  No interval cardiac testing for review today.  Assessment and Plan:  1.  Palpitations.  Well-controlled on Lopressor 25 mg twice daily with extra 1/2 to 1 tablet as needed.  ECG reviewed and stable.  She does not report any prolonged symptoms or syncope.  2.  Primary hypertension.  Blood pressure control reasonable today on current regimen including Norvasc 5 mg daily, HCTZ 25 mg daily, and Cozaar 100 mg  daily.  3.  Mixed hyperlipidemia.  LDL 92 in October 2024.  Continue Lipitor 40 mg daily.  Disposition:  Follow up  1 year.  Signed, Gerard Knight, M.D., F.A.C.C. [facility]

## 2023-06-15 ENCOUNTER — Encounter (INDEPENDENT_AMBULATORY_CARE_PROVIDER_SITE_OTHER): Payer: Medicare Other | Admitting: Ophthalmology

## 2023-06-15 DIAGNOSIS — H43811 Vitreous degeneration, right eye: Secondary | ICD-10-CM

## 2023-06-15 DIAGNOSIS — I10 Essential (primary) hypertension: Secondary | ICD-10-CM | POA: Diagnosis not present

## 2023-06-15 DIAGNOSIS — H353211 Exudative age-related macular degeneration, right eye, with active choroidal neovascularization: Secondary | ICD-10-CM

## 2023-06-15 DIAGNOSIS — H35031 Hypertensive retinopathy, right eye: Secondary | ICD-10-CM

## 2023-06-15 DIAGNOSIS — H2512 Age-related nuclear cataract, left eye: Secondary | ICD-10-CM

## 2023-11-08 ENCOUNTER — Encounter: Payer: Self-pay | Admitting: Oncology

## 2023-11-08 ENCOUNTER — Inpatient Hospital Stay: Attending: Oncology | Admitting: Oncology

## 2023-11-08 ENCOUNTER — Inpatient Hospital Stay

## 2023-11-08 VITALS — BP 130/52 | HR 64 | Temp 97.1°F | Resp 18 | Wt 172.0 lb

## 2023-11-08 DIAGNOSIS — D7589 Other specified diseases of blood and blood-forming organs: Secondary | ICD-10-CM

## 2023-11-08 DIAGNOSIS — Z79899 Other long term (current) drug therapy: Secondary | ICD-10-CM | POA: Insufficient documentation

## 2023-11-08 LAB — CBC (CANCER CENTER ONLY)
HCT: 34.7 % — ABNORMAL LOW (ref 36.0–46.0)
Hemoglobin: 11.9 g/dL — ABNORMAL LOW (ref 12.0–15.0)
MCH: 33.8 pg (ref 26.0–34.0)
MCHC: 34.3 g/dL (ref 30.0–36.0)
MCV: 98.6 fL (ref 80.0–100.0)
Platelet Count: 208 K/uL (ref 150–400)
RBC: 3.52 MIL/uL — ABNORMAL LOW (ref 3.87–5.11)
RDW: 12.5 % (ref 11.5–15.5)
WBC Count: 6.3 K/uL (ref 4.0–10.5)
nRBC: 0 % (ref 0.0–0.2)

## 2023-11-08 LAB — LACTATE DEHYDROGENASE: LDH: 152 U/L (ref 98–192)

## 2023-11-08 LAB — FERRITIN: Ferritin: 50 ng/mL (ref 11–307)

## 2023-11-08 LAB — IRON AND TIBC
Iron: 84 ug/dL (ref 28–170)
Saturation Ratios: 20 % (ref 10.4–31.8)
TIBC: 414 ug/dL (ref 250–450)
UIBC: 330 ug/dL

## 2023-11-08 NOTE — Progress Notes (Unsigned)
Patient is feeling tired.

## 2023-11-08 NOTE — Progress Notes (Unsigned)
 [facility]  [phone] [phone]) [phone]  ID: Alisha Shaw OB: Mar 08, 1950  MR#: [id number]  RDW#:[id number]  Patient Care Team: Buren Rock HERO, MD as PCP - General (Family Medicine) Debera Jayson MATSU, MD as PCP - Cardiology (Cardiology) Shaaron Lamar HERO, MD as Consulting Physician (Gastroenterology)  CHIEF COMPLAINT: Macrocytosis.  INTERVAL HISTORY: Patient is a 73 year old female who was noted to have an abnormal CBC on routine blood work.  She is referred for further evaluation.  She has chronic fatigue, but otherwise feels well.  She has no neurologic complaints.  She denies any recent fevers or illnesses.  She has a good appetite and denies weight loss.  She has no chest pain, shortness of breath, cough, or hemoptysis.  She denies any nausea, vomiting, constipation, or diarrhea.  She has no urinary complaints.  Patient offers no further specific complaints today.  REVIEW OF SYSTEMS:   Review of Systems  Constitutional:  Positive for malaise/fatigue. Negative for fever and weight loss.  Respiratory: Negative.  Negative for cough, hemoptysis and shortness of breath.   Cardiovascular: Negative.  Negative for chest pain and leg swelling.  Gastrointestinal: Negative.  Negative for abdominal pain, blood in stool and melena.  Genitourinary: Negative.  Negative for dysuria.  Musculoskeletal: Negative.  Negative for back pain.  Skin: Negative.  Negative for rash.  Neurological: Negative.  Negative for dizziness, focal weakness, weakness and headaches.  Psychiatric/Behavioral: Negative.  The patient is not nervous/anxious.     As per HPI. Otherwise, a complete review of systems is negative.  PAST MEDICAL HISTORY: Past Medical History:  Diagnosis Date   Anxiety    COPD (chronic obstructive pulmonary disease) (HCC)    Depression    GERD (gastroesophageal reflux disease)    History of kidney stones    Hypertension    Migraine    Palpitations     PAST  SURGICAL HISTORY: Past Surgical History:  Procedure Laterality Date   ABDOMINAL HYSTERECTOMY  1995   APPENDECTOMY     removed when had gallbladder surgery   CATARACT EXTRACTION W/PHACO Right 08/27/2019   Procedure: CATARACT EXTRACTION PHACO AND INTRAOCULAR LENS PLACEMENT (IOC);  Surgeon: Harrie Agent, MD;  Location: [facility];  Service: Ophthalmology;  Laterality: Right;  CDE: 6.90   CHOLECYSTECTOMY  1976   [place]   EYE SURGERY Left 2012   [place] deg.   HEMORRHOID SURGERY N/A 07/17/2012   Procedure: HEMORRHOIDECTOMY;  Surgeon: Oneil DELENA Budge, MD;  Location: [facility];  Service: General;  Laterality: N/A;   spider bite Left 2011   brown recluse spider bite   TOTAL HIP ARTHROPLASTY Right 12/03/2019   Procedure: TOTAL HIP ARTHROPLASTY ANTERIOR APPROACH;  Surgeon: Leora Agent SAUNDERS, MD;  Location: [facility];  Service: Orthopedics;  Laterality: Right;    FAMILY HISTORY: Family History  Problem Relation Age of Onset   Parkinson's disease Father    Parkinson's disease Sister    CVA Brother    CAD Brother     ADVANCED DIRECTIVES (Y/N):  N  HEALTH MAINTENANCE: Social History   Tobacco Use   Smoking status: Former    Current packs/[place]: 0.00    Average packs/[place]ler: 0.3 packs/[place] for 50.0 years (12.5 ttl pk-yrs)    Types: Cigarettes    Start date: 11/07/1969    Quit date: 11/08/2019    Years since quitting: 4.0   Smokeless tobacco: Never   Tobacco comments:    only smoking 2 -3 cigs [place]  Vaping Use   Vaping status:  Every [place]  Substance Use Topics   Alcohol use: Not Currently    Comment: socially   Drug use: No     Colonoscopy:  PAP:  Bone density:  Lipid panel:  Allergies  Allergen Reactions   Ciprofloxacin     Makes my mouth sore    Current Outpatient Medications  Medication Sig Dispense Refill   amLODipine  (NORVASC ) 5 MG tablet Take 5 mg by mouth daily.     atorvastatin  (LIPITOR) 40 MG tablet Take 40 mg by mouth daily.     atropine  1 % ophthalmic solution Place 1 drop  into the left eye every Sunday.      b complex vitamins capsule Take 1 capsule by mouth daily.     betamethasone dipropionate (DIPROLENE) 0.05 % ointment Apply 1 application topically daily as needed (psoriasis).      Cholecalciferol (VITAMIN D) 50 MCG (2000 UT) tablet Take 2,000 Units by mouth daily.      famotidine  (PEPCID ) 20 MG tablet Take 20 mg by mouth 2 (two) times daily.      FLUoxetine  (PROZAC ) 40 MG capsule Take 40 mg by mouth daily.     hydrochlorothiazide  (HYDRODIURIL ) 25 MG tablet Take 25 mg by mouth daily.     HYDROcodone -acetaminophen  (NORCO) 7.5-325 MG tablet Take 1-2 tablets by mouth every 4 (four) hours as needed (pain). 50 tablet 0   hydrocortisone cream 1 % Apply 1 application topically daily as needed for itching.     ibuprofen (ADVIL) 200 MG tablet Take 800 mg by mouth every 6 (six) hours as needed for moderate pain.     losartan  (COZAAR ) 100 MG tablet Take 100 mg by mouth daily.      metoprolol  tartrate (LOPRESSOR ) 25 MG tablet TAKE 1 TABLET BY MOUTH TWICE DAILY (MAY  TAKE  AN  EXTRA  HALF  TABLET  ONCE  DIALY  FOR  PALPITATIONS) 190 tablet 3   oxymetazoline  (AFRIN) 0.05 % nasal spray Place 1 spray into both nostrils daily.     potassium chloride (KLOR-CON) 10 MEQ tablet Take 10 mEq by mouth in the morning and at bedtime.     thiamine (VITAMIN B-1) 50 MG tablet Take 50 mg by mouth daily.     albuterol  (VENTOLIN  HFA) 108 (90 Base) MCG/ACT inhaler Inhale 2 puffs into the lungs every 6 (six) hours as needed for wheezing or shortness of breath. (Patient not taking: Reported on 11/08/2023)     aspirin  81 MG chewable tablet Chew 1 tablet (81 mg total) by mouth 2 (two) times daily. (Patient not taking: Reported on 11/08/2023) 60 tablet 0   docusate sodium  (COLACE) 100 MG capsule Take 1 capsule (100 mg total) by mouth 2 (two) times daily. (Patient not taking: Reported on 11/08/2023) 30 capsule 0   gabapentin (NEURONTIN) 300 MG capsule gabapentin 300 mg capsule  TAKE 1 CAPSULE BY MOUTH  ONCE DAILY AT BEDTIME FOR 30 DAYS (Patient not taking: Reported on 11/08/2023)     methocarbamol (ROBAXIN) 500 MG tablet Take 500 mg by mouth in the morning and at bedtime. (Patient not taking: Reported on 11/08/2023)     Multiple Vitamins-Minerals (PRESERVISION AREDS 2 PO) Take 1 capsule by mouth 2 (two) times daily.  (Patient not taking: Reported on 11/08/2023)     traMADol (ULTRAM) 50 MG tablet Take 50 mg by mouth every 6 (six) hours as needed for pain. (Patient not taking: Reported on 11/08/2023)     No current facility-administered medications for this visit.  OBJECTIVE: Vitals:   11/08/23 1120  BP: (!) 130/52  Pulse: 64  Resp: 18  Temp: (!) 97.1 F (36.2 C)  SpO2: 97%     Body mass index is 29.52 kg/m.    ECOG FS:0 - Asymptomatic  General: Well-developed, well-nourished, no acute distress. Eyes: Pink conjunctiva, anicteric sclera. HEENT: Normocephalic, moist mucous membranes. Lungs: No audible wheezing or coughing. Heart: Regular rate and rhythm. Abdomen: Soft, nontender, no obvious distention. Musculoskeletal: No edema, cyanosis, or clubbing. Neuro: Alert, answering all questions appropriately. Cranial nerves grossly intact. Skin: No rashes or petechiae noted. Psych: Normal affect. Lymphatics: No cervical, calvicular, axillary or inguinal LAD.   LAB RESULTS:  Lab Results  Component Value Date   NA 132 (L) 12/04/2019   K 3.5 12/04/2019   CL 95 (L) 12/04/2019   CO2 26 12/04/2019   GLUCOSE 109 (H) 12/04/2019   BUN 15 12/04/2019   CREATININE 0.71 12/04/2019   CALCIUM  8.7 (L) 12/04/2019   GFRNONAA >60 12/04/2019   GFRAA >60 08/24/2019    Lab Results  Component Value Date   WBC 6.3 11/08/2023   NEUTROABS 6.1 07/13/2012   HGB 11.9 (L) 11/08/2023   HCT 34.7 (L) 11/08/2023   MCV 98.6 11/08/2023   PLT 208 11/08/2023   Lab Results  Component Value Date   IRON 84 11/08/2023   TIBC 414 11/08/2023   IRONPCTSAT 20 11/08/2023   Lab Results  Component Value Date    FERRITIN 50 11/08/2023     STUDIES: No results found.  ASSESSMENT: Macrocytosis:  PLAN:    Macrocytosis: Patient has a mildly decreased hemoglobin 11.9, but her MCV is now within normal limits.  Previously, B12 and folate were also within normal limits.  Iron panel and hemolysis labs are also within normal limits.  Peripheral blood flow cytometry and myeloid NGS panel are pending at time of dictation.  No intervention is needed.  Patient return to clinic in 4 weeks after the Thanksgiving holiday for further evaluation and discussion of her laboratory results.  I spent a total of 45 minutes reviewing chart data, face-to-face evaluation with the patient, counseling and coordination of care as detailed above.    Patient expressed understanding and was in agreement with this plan. She also understands that She can call clinic at any time with any questions, concerns, or complaints.    Alisha JINNY Reusing, MD   11/09/2023 3:13 PM

## 2023-11-09 LAB — HAPTOGLOBIN: Haptoglobin: 168 mg/dL (ref 42–346)

## 2023-11-10 LAB — COMP PANEL: LEUKEMIA/LYMPHOMA

## 2023-11-20 LAB — MYELOID NGS

## 2023-12-07 ENCOUNTER — Encounter: Payer: Self-pay | Admitting: Oncology

## 2023-12-07 ENCOUNTER — Inpatient Hospital Stay: Attending: Oncology | Admitting: Oncology

## 2023-12-07 VITALS — BP 137/62 | HR 80 | Temp 97.1°F | Resp 18 | Ht 64.0 in | Wt 173.3 lb

## 2023-12-07 DIAGNOSIS — F17290 Nicotine dependence, other tobacco product, uncomplicated: Secondary | ICD-10-CM | POA: Insufficient documentation

## 2023-12-07 DIAGNOSIS — D7589 Other specified diseases of blood and blood-forming organs: Secondary | ICD-10-CM | POA: Diagnosis present

## 2023-12-07 NOTE — Progress Notes (Unsigned)
 [facility]  Telephone:(336) [phone] Fax:(336) [phone]  ID: Alisha Shaw OB: 1950/01/29  MR#: [id number]  RDW#:[id number]  Patient Care Team: Buren Rock HERO, MD as PCP - General (Family Medicine) Debera Jayson MATSU, MD as PCP - Cardiology (Cardiology) Shaaron Lamar HERO, MD as Consulting Physician (Gastroenterology)  CHIEF COMPLAINT: Macrocytosis.  INTERVAL HISTORY: Patient returns to clinic today for further evaluation and discussion of her laboratory results.  She has chronic fatigue, but otherwise feels well.  She has no neurologic complaints.  She denies any recent fevers or illnesses.  She has a good appetite and denies weight loss.  She has no chest pain, shortness of breath, cough, or hemoptysis.  She denies any nausea, vomiting, constipation, or diarrhea.  She has no urinary complaints.  Patient offers no further specific complaints today.  REVIEW OF SYSTEMS:   Review of Systems  Constitutional:  Positive for malaise/fatigue. Negative for fever and weight loss.  Respiratory: Negative.  Negative for cough, hemoptysis and shortness of breath.   Cardiovascular: Negative.  Negative for chest pain and leg swelling.  Gastrointestinal: Negative.  Negative for abdominal pain, blood in stool and melena.  Genitourinary: Negative.  Negative for dysuria.  Musculoskeletal: Negative.  Negative for back pain.  Skin: Negative.  Negative for rash.  Neurological: Negative.  Negative for dizziness, focal weakness, weakness and headaches.  Psychiatric/Behavioral: Negative.  The patient is not nervous/anxious.     As per HPI. Otherwise, a complete review of systems is negative.  PAST MEDICAL HISTORY: Past Medical History:  Diagnosis Date   Anxiety    COPD (chronic obstructive pulmonary disease) (HCC)    Depression    GERD (gastroesophageal reflux disease)    History of kidney stones    Hypertension    Migraine    Palpitations     PAST SURGICAL HISTORY: Past Surgical  History:  Procedure Laterality Date   ABDOMINAL HYSTERECTOMY  1995   APPENDECTOMY     removed when had gallbladder surgery   CATARACT EXTRACTION W/PHACO Right 08/27/2019   Procedure: CATARACT EXTRACTION PHACO AND INTRAOCULAR LENS PLACEMENT (IOC);  Surgeon: Harrie Agent, MD;  Location: [facility];  Service: Ophthalmology;  Laterality: Right;  CDE: 6.90   CHOLECYSTECTOMY  1976   [place]   EYE SURGERY Left 2012   Magular deg.   HEMORRHOID SURGERY N/A 07/17/2012   Procedure: HEMORRHOIDECTOMY;  Surgeon: Oneil DELENA Budge, MD;  Location: [facility];  Service: General;  Laterality: N/A;   spider bite Left 2011   brown recluse spider bite   TOTAL HIP ARTHROPLASTY Right 12/03/2019   Procedure: TOTAL HIP ARTHROPLASTY ANTERIOR APPROACH;  Surgeon: Leora Agent SAUNDERS, MD;  Location: [facility];  Service: Orthopedics;  Laterality: Right;    FAMILY HISTORY: Family History  Problem Relation Age of Onset   Parkinson's disease Father    Parkinson's disease Sister    CVA Brother    CAD Brother     ADVANCED DIRECTIVES (Y/N):  N  HEALTH MAINTENANCE: Social History   Tobacco Use   Smoking status: Former    Current packs/Hall[place]: 0.00    Average packs/Driver: 0.3 packs/[place] for 50.0 years (12.5 ttl pk-yrs)    Types: Cigarettes    Start date: 11/07/1969    Quit date: 11/08/2019    Years since quitting: 4.0   Smokeless tobacco: Never   Tobacco comments:    only smoking 2 -3 cigs [place]  Vaping Use   Vaping status: Every [place]  Substance Use Topics   Alcohol use:  Not Currently    Comment: socially   Drug use: No     Colonoscopy:  PAP:  Bone density:  Lipid panel:  Allergies  Allergen Reactions   Ciprofloxacin     Makes my mouth sore    Current Outpatient Medications  Medication Sig Dispense Refill   amLODipine  (NORVASC ) 5 MG tablet Take 5 mg by mouth daily.     atorvastatin  (LIPITOR) 40 MG tablet Take 40 mg by mouth daily.     atropine  1 % ophthalmic solution Place 1 drop into the left eye every Sunday.       b complex vitamins capsule Take 1 capsule by mouth daily.     betamethasone dipropionate (DIPROLENE) 0.05 % ointment Apply 1 application topically daily as needed (psoriasis).      Cholecalciferol (VITAMIN D) 50 MCG (2000 UT) tablet Take 2,000 Units by mouth daily.      famotidine  (PEPCID ) 20 MG tablet Take 20 mg by mouth 2 (two) times daily.      FLUoxetine  (PROZAC ) 40 MG capsule Take 40 mg by mouth daily.     hydrochlorothiazide  (HYDRODIURIL ) 25 MG tablet Take 25 mg by mouth daily.     HYDROcodone -acetaminophen  (NORCO) 7.5-325 MG tablet Take 1-2 tablets by mouth every 4 (four) hours as needed (pain). 50 tablet 0   hydrocortisone cream 1 % Apply 1 application topically daily as needed for itching.     ibuprofen (ADVIL) 200 MG tablet Take 800 mg by mouth every 6 (six) hours as needed for moderate pain.     losartan  (COZAAR ) 100 MG tablet Take 100 mg by mouth daily.      metoprolol  tartrate (LOPRESSOR ) 25 MG tablet TAKE 1 TABLET BY MOUTH TWICE DAILY (MAY  TAKE  AN  EXTRA  HALF  TABLET  ONCE  DIALY  FOR  PALPITATIONS) 190 tablet 3   Multiple Vitamins-Minerals (PRESERVISION AREDS 2 PO) Take 1 capsule by mouth 2 (two) times daily.      oxymetazoline  (AFRIN) 0.05 % nasal spray Place 1 spray into both nostrils daily.     potassium chloride (KLOR-CON) 10 MEQ tablet Take 10 mEq by mouth in the morning and at bedtime.     thiamine (VITAMIN B-1) 50 MG tablet Take 50 mg by mouth daily.     albuterol  (VENTOLIN  HFA) 108 (90 Base) MCG/ACT inhaler Inhale 2 puffs into the lungs every 6 (six) hours as needed for wheezing or shortness of breath. (Patient not taking: Reported on 12/07/2023)     aspirin  81 MG chewable tablet Chew 1 tablet (81 mg total) by mouth 2 (two) times daily. (Patient not taking: Reported on 12/07/2023) 60 tablet 0   docusate sodium  (COLACE) 100 MG capsule Take 1 capsule (100 mg total) by mouth 2 (two) times daily. (Patient not taking: Reported on 12/07/2023) 30 capsule 0   gabapentin  (NEURONTIN) 300 MG capsule gabapentin 300 mg capsule  TAKE 1 CAPSULE BY MOUTH ONCE DAILY AT BEDTIME FOR 30 DAYS (Patient not taking: Reported on 12/07/2023)     methocarbamol (ROBAXIN) 500 MG tablet Take 500 mg by mouth in the morning and at bedtime. (Patient not taking: Reported on 12/07/2023)     traMADol (ULTRAM) 50 MG tablet Take 50 mg by mouth every 6 (six) hours as needed for pain. (Patient not taking: Reported on 12/07/2023)     No current facility-administered medications for this visit.    OBJECTIVE: Vitals:   12/07/23 0958  BP: 137/62  Pulse: 80  Resp: 18  Temp: (!) 97.1 F (36.2 C)  SpO2: 98%     Body mass index is 29.75 kg/m.    ECOG FS:0 - Asymptomatic  General: Well-developed, well-nourished, no acute distress. Eyes: Pink conjunctiva, anicteric sclera. HEENT: Normocephalic, moist mucous membranes. Lungs: No audible wheezing or coughing. Heart: Regular rate and rhythm. Abdomen: Soft, nontender, no obvious distention. Musculoskeletal: No edema, cyanosis, or clubbing. Neuro: Alert, answering all questions appropriately. Cranial nerves grossly intact. Skin: No rashes or petechiae noted. Psych: Normal affect.  LAB RESULTS:  Lab Results  Component Value Date   NA 132 (L) 12/04/2019   K 3.5 12/04/2019   CL 95 (L) 12/04/2019   CO2 26 12/04/2019   GLUCOSE 109 (H) 12/04/2019   BUN 15 12/04/2019   CREATININE 0.71 12/04/2019   CALCIUM  8.7 (L) 12/04/2019   GFRNONAA >60 12/04/2019   GFRAA >60 08/24/2019    Lab Results  Component Value Date   WBC 6.3 11/08/2023   NEUTROABS 6.1 07/13/2012   HGB 11.9 (L) 11/08/2023   HCT 34.7 (L) 11/08/2023   MCV 98.6 11/08/2023   PLT 208 11/08/2023   Lab Results  Component Value Date   IRON 84 11/08/2023   TIBC 414 11/08/2023   IRONPCTSAT 20 11/08/2023   Lab Results  Component Value Date   FERRITIN 50 11/08/2023     STUDIES: No results found.  ASSESSMENT: Macrocytosis.  PLAN:    Macrocytosis: Patient has a mildly  decreased hemoglobin 11.9, but her MCV is now within normal limits.  Previously, B12 and folate were also within normal limits.  Iron panel, hemolysis labs, peripheral blood flow cytometry and myeloid NGS panel are all either negative or within normal limits.  No intervention is needed.  Patient does not require bone marrow biopsy.  No further follow-up has been scheduled.  Please refer patient back if there are any questions or concerns.  I spent a total of 20 minutes reviewing chart data, face-to-face evaluation with the patient, counseling and coordination of care as detailed above.    Patient expressed understanding and was in agreement with this plan. She also understands that She can call clinic at any time with any questions, concerns, or complaints.    Alisha JINNY Reusing, MD   12/07/2023 10:31 AM

## 2024-04-30 ENCOUNTER — Ambulatory Visit: Admitting: Cardiology

## 2024-06-13 ENCOUNTER — Encounter (INDEPENDENT_AMBULATORY_CARE_PROVIDER_SITE_OTHER): Admitting: Ophthalmology
